# Patient Record
Sex: Female | Born: 1960
Health system: Southern US, Community
[De-identification: ages and names within clinical notes are randomized; demographics above are authoritative.]

## PROBLEM LIST (undated history)

## (undated) DIAGNOSIS — T7840XA Allergy, unspecified, initial encounter: Secondary | ICD-10-CM

## (undated) DIAGNOSIS — B009 Herpesviral infection, unspecified: Secondary | ICD-10-CM

## (undated) DIAGNOSIS — I219 Acute myocardial infarction, unspecified: Secondary | ICD-10-CM

## (undated) DIAGNOSIS — E785 Hyperlipidemia, unspecified: Secondary | ICD-10-CM

## (undated) DIAGNOSIS — B351 Tinea unguium: Secondary | ICD-10-CM

## (undated) DIAGNOSIS — I10 Essential (primary) hypertension: Secondary | ICD-10-CM

## (undated) DIAGNOSIS — I2510 Atherosclerotic heart disease of native coronary artery without angina pectoris: Secondary | ICD-10-CM

## (undated) DIAGNOSIS — J3081 Allergic rhinitis due to animal (cat) (dog) hair and dander: Secondary | ICD-10-CM

## (undated) DIAGNOSIS — K5790 Diverticulosis of intestine, part unspecified, without perforation or abscess without bleeding: Secondary | ICD-10-CM

## (undated) HISTORY — DX: Allergic rhinitis due to animal (cat) (dog) hair and dander: J30.81

## (undated) HISTORY — DX: Allergy, unspecified, initial encounter: T78.40XA

## (undated) HISTORY — DX: Herpesviral infection, unspecified: B00.9

## (undated) HISTORY — DX: Acute myocardial infarction, unspecified: I21.9

## (undated) HISTORY — DX: Diverticulosis of intestine, part unspecified, without perforation or abscess without bleeding: K57.90

## (undated) HISTORY — DX: Tinea unguium: B35.1

---

## 1980-06-12 HISTORY — PX: OTHER SURGICAL HISTORY: SHX169

## 1984-06-12 HISTORY — PX: REDUCTION MAMMAPLASTY: SUR839

## 1997-12-16 ENCOUNTER — Other Ambulatory Visit: Admission: RE | Admit: 1997-12-16 | Discharge: 1997-12-16 | Payer: Self-pay | Admitting: Obstetrics and Gynecology

## 1999-06-17 ENCOUNTER — Other Ambulatory Visit: Admission: RE | Admit: 1999-06-17 | Discharge: 1999-06-17 | Payer: Self-pay | Admitting: Obstetrics and Gynecology

## 2000-05-22 ENCOUNTER — Ambulatory Visit (HOSPITAL_COMMUNITY): Admission: RE | Admit: 2000-05-22 | Discharge: 2000-05-22 | Payer: Self-pay | Admitting: Obstetrics and Gynecology

## 2000-05-22 ENCOUNTER — Encounter: Payer: Self-pay | Admitting: Obstetrics and Gynecology

## 2000-07-04 ENCOUNTER — Other Ambulatory Visit: Admission: RE | Admit: 2000-07-04 | Discharge: 2000-07-04 | Payer: Self-pay | Admitting: Obstetrics and Gynecology

## 2001-07-15 ENCOUNTER — Encounter: Payer: Self-pay | Admitting: Obstetrics and Gynecology

## 2001-07-15 ENCOUNTER — Ambulatory Visit (HOSPITAL_COMMUNITY): Admission: RE | Admit: 2001-07-15 | Discharge: 2001-07-15 | Payer: Self-pay | Admitting: Obstetrics and Gynecology

## 2001-07-23 ENCOUNTER — Other Ambulatory Visit: Admission: RE | Admit: 2001-07-23 | Discharge: 2001-07-23 | Payer: Self-pay | Admitting: Obstetrics and Gynecology

## 2002-07-16 ENCOUNTER — Ambulatory Visit (HOSPITAL_COMMUNITY): Admission: RE | Admit: 2002-07-16 | Discharge: 2002-07-16 | Payer: Self-pay | Admitting: Obstetrics and Gynecology

## 2002-07-16 ENCOUNTER — Encounter: Payer: Self-pay | Admitting: Obstetrics and Gynecology

## 2002-07-31 ENCOUNTER — Other Ambulatory Visit: Admission: RE | Admit: 2002-07-31 | Discharge: 2002-07-31 | Payer: Self-pay | Admitting: Obstetrics and Gynecology

## 2003-07-24 ENCOUNTER — Ambulatory Visit (HOSPITAL_COMMUNITY): Admission: RE | Admit: 2003-07-24 | Discharge: 2003-07-24 | Payer: Self-pay | Admitting: Obstetrics and Gynecology

## 2003-08-05 ENCOUNTER — Other Ambulatory Visit: Admission: RE | Admit: 2003-08-05 | Discharge: 2003-08-05 | Payer: Self-pay | Admitting: Obstetrics and Gynecology

## 2004-01-06 ENCOUNTER — Other Ambulatory Visit: Admission: RE | Admit: 2004-01-06 | Discharge: 2004-01-06 | Payer: Self-pay | Admitting: Obstetrics and Gynecology

## 2004-07-21 ENCOUNTER — Ambulatory Visit (HOSPITAL_COMMUNITY): Admission: RE | Admit: 2004-07-21 | Discharge: 2004-07-21 | Payer: Self-pay | Admitting: Obstetrics and Gynecology

## 2004-08-24 ENCOUNTER — Other Ambulatory Visit: Admission: RE | Admit: 2004-08-24 | Discharge: 2004-08-24 | Payer: Self-pay | Admitting: Obstetrics and Gynecology

## 2005-08-02 ENCOUNTER — Ambulatory Visit (HOSPITAL_COMMUNITY): Admission: RE | Admit: 2005-08-02 | Discharge: 2005-08-02 | Payer: Self-pay | Admitting: Obstetrics and Gynecology

## 2005-08-22 ENCOUNTER — Other Ambulatory Visit: Admission: RE | Admit: 2005-08-22 | Discharge: 2005-08-22 | Payer: Self-pay | Admitting: Obstetrics & Gynecology

## 2006-08-21 ENCOUNTER — Ambulatory Visit (HOSPITAL_COMMUNITY): Admission: RE | Admit: 2006-08-21 | Discharge: 2006-08-21 | Payer: Self-pay | Admitting: Obstetrics and Gynecology

## 2006-09-28 ENCOUNTER — Other Ambulatory Visit: Admission: RE | Admit: 2006-09-28 | Discharge: 2006-09-28 | Payer: Self-pay | Admitting: Obstetrics and Gynecology

## 2007-08-22 ENCOUNTER — Ambulatory Visit (HOSPITAL_COMMUNITY): Admission: RE | Admit: 2007-08-22 | Discharge: 2007-08-22 | Payer: Self-pay | Admitting: Obstetrics and Gynecology

## 2007-10-04 ENCOUNTER — Other Ambulatory Visit: Admission: RE | Admit: 2007-10-04 | Discharge: 2007-10-04 | Payer: Self-pay | Admitting: Obstetrics and Gynecology

## 2008-09-08 ENCOUNTER — Ambulatory Visit (HOSPITAL_COMMUNITY): Admission: RE | Admit: 2008-09-08 | Discharge: 2008-09-08 | Payer: Self-pay | Admitting: Obstetrics and Gynecology

## 2008-10-12 ENCOUNTER — Other Ambulatory Visit: Admission: RE | Admit: 2008-10-12 | Discharge: 2008-10-12 | Payer: Self-pay | Admitting: Obstetrics and Gynecology

## 2009-09-15 ENCOUNTER — Ambulatory Visit (HOSPITAL_COMMUNITY): Admission: RE | Admit: 2009-09-15 | Discharge: 2009-09-15 | Payer: Self-pay | Admitting: Obstetrics and Gynecology

## 2009-10-10 HISTORY — PX: ENDOMETRIAL BIOPSY: SHX622

## 2010-09-09 ENCOUNTER — Other Ambulatory Visit: Payer: Self-pay | Admitting: Obstetrics and Gynecology

## 2010-09-09 DIAGNOSIS — Z1231 Encounter for screening mammogram for malignant neoplasm of breast: Secondary | ICD-10-CM

## 2010-09-22 ENCOUNTER — Ambulatory Visit (HOSPITAL_COMMUNITY): Payer: BC Managed Care – PPO

## 2010-09-29 ENCOUNTER — Ambulatory Visit (HOSPITAL_COMMUNITY)
Admission: RE | Admit: 2010-09-29 | Discharge: 2010-09-29 | Disposition: A | Discharge status: Home / Self Care | Payer: BC Managed Care – PPO | Source: Ambulatory Visit | Attending: Obstetrics and Gynecology | Admitting: Obstetrics and Gynecology

## 2010-09-29 DIAGNOSIS — Z1231 Encounter for screening mammogram for malignant neoplasm of breast: Secondary | ICD-10-CM | POA: Insufficient documentation

## 2011-04-13 DIAGNOSIS — K5790 Diverticulosis of intestine, part unspecified, without perforation or abscess without bleeding: Secondary | ICD-10-CM

## 2011-04-13 HISTORY — DX: Diverticulosis of intestine, part unspecified, without perforation or abscess without bleeding: K57.90

## 2011-04-28 ENCOUNTER — Ambulatory Visit (AMBULATORY_SURGERY_CENTER): Payer: BC Managed Care – PPO | Admitting: *Deleted

## 2011-04-28 VITALS — Ht 63.0 in | Wt 147.1 lb

## 2011-04-28 DIAGNOSIS — Z1211 Encounter for screening for malignant neoplasm of colon: Secondary | ICD-10-CM

## 2011-04-28 MED ORDER — PEG-KCL-NACL-NASULF-NA ASC-C 100 G PO SOLR: ORAL | Status: DC

## 2011-05-12 ENCOUNTER — Ambulatory Visit (AMBULATORY_SURGERY_CENTER): Payer: BC Managed Care – PPO | Admitting: Internal Medicine

## 2011-05-12 ENCOUNTER — Encounter: Payer: Self-pay | Admitting: Internal Medicine

## 2011-05-12 VITALS — BP 133/89 | HR 68 | Temp 98.0°F | Resp 18 | Ht 63.0 in | Wt 147.0 lb

## 2011-05-12 DIAGNOSIS — Z1211 Encounter for screening for malignant neoplasm of colon: Secondary | ICD-10-CM

## 2011-05-12 MED ORDER — SODIUM CHLORIDE 0.9 % IV SOLN: 500.0000 mL | INTRAVENOUS | Status: DC

## 2011-05-12 NOTE — Progress Notes (Signed)
Patient did not experience any of the following events: a burn prior to discharge; a fall within the facility; wrong site/side/patient/procedure/implant event; or a hospital transfer or hospital admission upon discharge from the facility. (G8907) Patient did not have preoperative order for IV antibiotic SSI prophylaxis. (G8918)  

## 2011-05-12 NOTE — Patient Instructions (Signed)
Discharge instructions given with verbal understanding. Handouts on diverticulosis and a high fiber diet given. Resume previous medications. 

## 2011-05-12 NOTE — Progress Notes (Signed)
Pt tolerated the colonoscopy very well. maw 

## 2011-05-15 ENCOUNTER — Telehealth: Payer: Self-pay

## 2011-05-15 NOTE — Telephone Encounter (Signed)
Left message

## 2011-06-13 HISTORY — PX: COLONOSCOPY: SHX174

## 2011-08-22 ENCOUNTER — Other Ambulatory Visit: Payer: Self-pay | Admitting: Obstetrics and Gynecology

## 2011-08-22 DIAGNOSIS — Z1231 Encounter for screening mammogram for malignant neoplasm of breast: Secondary | ICD-10-CM

## 2011-10-03 ENCOUNTER — Ambulatory Visit (HOSPITAL_COMMUNITY)
Admission: RE | Admit: 2011-10-03 | Discharge: 2011-10-03 | Disposition: A | Discharge status: Home / Self Care | Payer: BC Managed Care – PPO | Source: Ambulatory Visit | Attending: Obstetrics and Gynecology | Admitting: Obstetrics and Gynecology

## 2011-10-03 DIAGNOSIS — Z1231 Encounter for screening mammogram for malignant neoplasm of breast: Secondary | ICD-10-CM | POA: Insufficient documentation

## 2012-11-12 ENCOUNTER — Other Ambulatory Visit: Payer: Self-pay | Admitting: Obstetrics and Gynecology

## 2012-11-12 DIAGNOSIS — Z1231 Encounter for screening mammogram for malignant neoplasm of breast: Secondary | ICD-10-CM

## 2012-11-14 ENCOUNTER — Ambulatory Visit (HOSPITAL_COMMUNITY)
Admission: RE | Admit: 2012-11-14 | Discharge: 2012-11-14 | Disposition: A | Discharge status: Home / Self Care | Payer: BC Managed Care – PPO | Source: Ambulatory Visit | Attending: Obstetrics and Gynecology | Admitting: Obstetrics and Gynecology

## 2012-11-14 DIAGNOSIS — Z1231 Encounter for screening mammogram for malignant neoplasm of breast: Secondary | ICD-10-CM | POA: Insufficient documentation

## 2013-01-15 ENCOUNTER — Ambulatory Visit: Payer: Self-pay | Admitting: Obstetrics and Gynecology

## 2013-01-16 ENCOUNTER — Encounter: Payer: Self-pay | Admitting: Obstetrics and Gynecology

## 2013-01-17 ENCOUNTER — Ambulatory Visit (INDEPENDENT_AMBULATORY_CARE_PROVIDER_SITE_OTHER): Payer: BC Managed Care – PPO | Admitting: Obstetrics and Gynecology

## 2013-01-17 ENCOUNTER — Encounter: Payer: Self-pay | Admitting: Obstetrics and Gynecology

## 2013-01-17 VITALS — BP 110/74 | HR 76 | Resp 18 | Ht 62.5 in | Wt 155.0 lb

## 2013-01-17 DIAGNOSIS — Z01419 Encounter for gynecological examination (general) (routine) without abnormal findings: Secondary | ICD-10-CM

## 2013-01-17 DIAGNOSIS — Z0000 Encounter for general adult medical examination without abnormal findings: Secondary | ICD-10-CM

## 2013-01-17 LAB — POCT URINALYSIS DIPSTICK
Blood, UA: NEGATIVE
Glucose, UA: NEGATIVE
Nitrite, UA: NEGATIVE
Protein, UA: NEGATIVE
pH, UA: 5

## 2013-01-17 LAB — HEMOGLOBIN, FINGERSTICK: Hemoglobin, fingerstick: 13.8 g/dL (ref 12.0–16.0)

## 2013-01-17 NOTE — Progress Notes (Signed)
52 y.o.   Married /Separated  Caucasian   female   G2P0020   here for annual exam.    Patient's last menstrual period was 06/12/2009.          Sexually active: yes  The current method of family planning is post menopausal status.    Exercising: walking, cycling 2-4 days a week Last mammogram:  10/2012 normal Last pap smear:12/20/09 neg History of abnormal pap: no Smoking: no Alcohol: 4-5 drinks a week (beer or wine) Last colonoscopy:05/12/11 diverticulosis Last Bone Density:  never Last tetanus shot:less than 10 years Last cholesterol check: 12/27/11 normal  Hgb:  13.8              Urine: neg   Family History  Problem Relation Age of Onset  . Colon cancer Neg Hx   . Esophageal cancer Neg Hx   . Stomach cancer Neg Hx   . Hypertension Father   . Heart disease Father   . Diabetes Father     There are no active problems to display for this patient.   Past Medical History  Diagnosis Date  . Cat allergies   . Diverticulosis 04/2011    Past Surgical History  Procedure Laterality Date  . Bilateral breast reduction  1982    Allergies: Review of patient's allergies indicates no known allergies.  Current Outpatient Prescriptions  Medication Sig Dispense Refill  . B Complex Vitamins (VITAMIN B COMPLEX) CAPS Take by mouth daily.        . Calcium Carbonate-Vitamin D (CALCIUM + D PO) Take 500 mg by mouth daily.       . Cetirizine HCl (ZYRTEC PO) Take by mouth daily.      . fish oil-omega-3 fatty acids 1000 MG capsule Take 2 g by mouth daily.      . Multiple Vitamin (MULTIVITAMIN) tablet Take 1 tablet by mouth daily.        . vitamin C (ASCORBIC ACID) 500 MG tablet Take 500 mg by mouth daily.         No current facility-administered medications for this visit.    ROS: Pertinent items are noted in HPI.  Social Hx: separated, no children, works as a Proofreader    Exam:    BP 110/74  Pulse 76  Resp 18  Ht 5' 2.5" (1.588 m)  Wt 155 lb (70.308 kg)  BMI 27.88 kg/m2   LMP 06/12/2009 Ht stable, wt up 5 pounds from last year  Wt Readings from Last 3 Encounters:  01/17/13 155 lb (70.308 kg)  05/12/11 147 lb (66.679 kg)  04/28/11 147 lb 1.6 oz (66.724 kg)     Ht Readings from Last 3 Encounters:  01/17/13 5' 2.5" (1.588 m)  05/12/11 5\' 3"  (1.6 m)  04/28/11 5\' 3"  (1.6 m)    General appearance: alert, cooperative and appears stated age Head: Normocephalic, without obvious abnormality, atraumatic Neck: no adenopathy, supple, symmetrical, trachea midline and thyroid not enlarged, symmetric, no tenderness/mass/nodules Lungs: clear to auscultation bilaterally Breasts: Inspection negative, No nipple retraction or dimpling, No nipple discharge or bleeding, No axillary or supraclavicular adenopathy, Normal to palpation without dominant masses Heart: regular rate and rhythm Abdomen: soft, non-tender; bowel sounds normal; no masses,  no organomegaly Extremities: extremities normal, atraumatic, no cyanosis or edema Skin: Skin color, texture, turgor normal. No rashes or lesions Lymph nodes: Cervical, supraclavicular, and axillary nodes normal. No abnormal inguinal nodes palpated Neurologic: Grossly normal   Pelvic: External genitalia:  no lesions  Urethra:  normal appearing urethra with no masses, tenderness or lesions              Bartholins and Skenes: normal                 Vagina: normal appearing vagina with normal color and discharge, no lesions              Cervix: normal appearance              Pap taken: yes        Bimanual Exam:  Uterus:  uterus is normal size, shape, consistency and nontender                                      Adnexa: normal adnexa in size, nontender and no masses                                      Rectovaginal: Confirms                                      Anus:  normal sphincter tone, no lesions  A: normal menopausal exam, no HRT     P:     mammogram pap smear counseled on breast self exam, mammography  screening, adequate intake of calcium and vitamin D, diet and exercise return annually or prn     An After Visit Summary was printed and given to the patient.

## 2013-01-17 NOTE — Patient Instructions (Signed)

## 2013-01-22 LAB — IPS PAP TEST WITH HPV

## 2013-11-14 ENCOUNTER — Other Ambulatory Visit: Payer: Self-pay | Admitting: Obstetrics and Gynecology

## 2013-11-14 DIAGNOSIS — Z1231 Encounter for screening mammogram for malignant neoplasm of breast: Secondary | ICD-10-CM

## 2013-11-25 ENCOUNTER — Ambulatory Visit (HOSPITAL_COMMUNITY)
Admission: RE | Admit: 2013-11-25 | Discharge: 2013-11-25 | Disposition: A | Discharge status: Home / Self Care | Payer: BC Managed Care – PPO | Source: Ambulatory Visit | Attending: Obstetrics and Gynecology | Admitting: Obstetrics and Gynecology

## 2013-11-25 DIAGNOSIS — Z1231 Encounter for screening mammogram for malignant neoplasm of breast: Secondary | ICD-10-CM

## 2014-01-23 ENCOUNTER — Ambulatory Visit: Payer: BC Managed Care – PPO | Admitting: Obstetrics and Gynecology

## 2014-02-09 ENCOUNTER — Encounter: Payer: Self-pay | Admitting: Obstetrics and Gynecology

## 2014-02-09 ENCOUNTER — Ambulatory Visit (INDEPENDENT_AMBULATORY_CARE_PROVIDER_SITE_OTHER): Payer: BC Managed Care – PPO | Admitting: Obstetrics and Gynecology

## 2014-02-09 VITALS — BP 118/78 | HR 60 | Resp 16 | Ht 63.0 in | Wt 163.2 lb

## 2014-02-09 DIAGNOSIS — Z23 Encounter for immunization: Secondary | ICD-10-CM

## 2014-02-09 DIAGNOSIS — Z01419 Encounter for gynecological examination (general) (routine) without abnormal findings: Secondary | ICD-10-CM

## 2014-02-09 DIAGNOSIS — Z0000 Encounter for general adult medical examination without abnormal findings: Secondary | ICD-10-CM

## 2014-02-09 LAB — POCT URINALYSIS DIPSTICK
Bilirubin, UA: NEGATIVE
Blood, UA: NEGATIVE
GLUCOSE UA: NEGATIVE
Ketones, UA: NEGATIVE
LEUKOCYTES UA: NEGATIVE
NITRITE UA: NEGATIVE
PROTEIN UA: NEGATIVE
Urobilinogen, UA: NEGATIVE
pH, UA: 5

## 2014-02-09 NOTE — Patient Instructions (Signed)

## 2014-02-09 NOTE — Progress Notes (Signed)
Patient ID: Leslie Roberts, female   DOB: Nov 29, 1960, 53 y.o.   MRN: [id number] GYNECOLOGY VISIT  PCP:   None  Referring provider:   HPI: 53 y.o.   Married  Caucasian  female   G2P0020 with Patient's last menstrual period was 06/12/2009.   here for  AEX.  Having external itching for the last year.  Feels dry.   Labs at work - cholesterol was under 200 and glucose was normal.  Hgb:    Through Work Urine:  Neg  GYNECOLOGIC HISTORY: Patient's last menstrual period was 06/12/2009. Sexually active:  yes Partner preference: female Contraception:  postmenopausal Menopausal hormone therapy: no DES exposure:   no Blood transfusions: no   Sexually transmitted diseases:  no GYN procedures and prior surgeries: Breast reduction, endometrial biopsy-benign.  Last mammogram:  11-25-13 wnl:[facility]              Last pap and high risk HPV testing:   01-17-13 wnl:neg HR HPV History of abnormal pap smear:  no   OB History   Grav Para Term Preterm Abortions TAB SAB Ect Mult Living   2 0   2 2    0       LIFESTYLE: Exercise:   Sometimes: walking/body pump and elliptical         OTHER HEALTH MAINTENANCE: Tetanus/TDap:   Unsure,  2004 HPV:                   n/a Influenza:            03/2013   Bone density:     never Colonoscopy:     05-12-11 diverticulosis.  Next colonoscopy due 04/2021.  Cholesterol check:   Normal through work screening   Family History  Problem Relation Age of Onset  . Colon cancer Neg Hx   . Esophageal cancer Neg Hx   . Stomach cancer Neg Hx   . Hypertension Father   . Heart disease Father   . Diabetes Father     There are no active problems to display for this patient.  Past Medical History  Diagnosis Date  . Cat allergies   . Diverticulosis 04/2011    Past Surgical History  Procedure Laterality Date  . Bilateral breast reduction  1982  . Endometrial biopsy  10/2009    benign    ALLERGIES: Review of patient's allergies indicates no known  allergies.  Current Outpatient Prescriptions  Medication Sig Dispense Refill  . B Complex Vitamins (VITAMIN B COMPLEX) CAPS Take by mouth daily.        . Calcium Carbonate-Vitamin D (CALCIUM + D PO) Take 500 mg by mouth daily.       . Cetirizine HCl (ZYRTEC PO) Take by mouth daily.      . fish oil-omega-3 fatty acids 1000 MG capsule Take 2 g by mouth daily.      . Multiple Vitamin (MULTIVITAMIN) tablet Take 1 tablet by mouth daily.        . vitamin C (ASCORBIC ACID) 500 MG tablet Take 500 mg by mouth daily.         No current facility-administered medications for this visit.     ROS:  Pertinent items are noted in HPI.  History   Social History  . Marital Status: Married    Spouse Name: N/A    Number of Children: N/A  . Years of Education: N/A   Occupational History  . Not on file.   Social History Main  Topics  . Smoking status: Never Smoker   . Smokeless tobacco: Never Used  . Alcohol Use: 2.4 - 3.6 oz/week    4-6 Glasses of wine per week     Comment: 4-6 drinks a week (wine or beer)  . Drug Use: No  . Sexual Activity: Yes    Partners: Male    Birth Control/ Protection: Post-menopausal   Other Topics Concern  . Not on file   Social History Narrative  . No narrative on file    PHYSICAL EXAMINATION:    BP 118/78  Pulse 60  Resp 16  Ht  (1.6 m)  Wt 163 lb 3.2 oz (74.027 kg)  BMI 28.92 kg/m2  LMP 06/12/2009   Wt Readings from Last 3 Encounters:  02/09/14 163 lb 3.2 oz (74.027 kg)  01/17/13 155 lb (70.308 kg)  05/12/11 147 lb (66.679 kg)     Ht Readings from Last 3 Encounters:  02/09/14  (1.6 m)  01/17/13 5' 2.5" (1.588 m)  05/12/11  (1.6 m)    General appearance: alert, cooperative and appears stated age Head: Normocephalic, without obvious abnormality, atraumatic Neck: no adenopathy, supple, symmetrical, trachea midline and thyroid not enlarged, symmetric, no tenderness/mass/nodules Lungs: clear to auscultation bilaterally Breasts:  Inspection negative, No nipple retraction or dimpling, No nipple discharge or bleeding, No axillary or supraclavicular adenopathy, Normal to palpation without dominant masses Heart: regular rate and rhythm Abdomen: soft, non-tender; no masses,  no organomegaly Extremities: extremities normal, atraumatic, no cyanosis or edema Skin: Skin color, texture, turgor normal. No rashes or lesions Lymph nodes: Cervical, supraclavicular, and axillary nodes normal. No abnormal inguinal nodes palpated Neurologic: Grossly normal  Pelvic: External genitalia:  no lesions              Urethra:  normal appearing urethra with no masses, tenderness or lesions              Bartholins and Skenes: normal                 Vagina: normal appearing vagina with erythema to mucosa and orangish discharge, no lesions              Cervix: normal appearance              Pap and high risk HPV testing done: No.        Bimanual Exam:  Uterus:  uterus is normal size, shape, consistency and nontender                                      Adnexa: normal adnexa in size, nontender and no masses                                      Rectovaginal:  Yes.                                        Confirms above.                                      Anus:  normal sphincter tone, no lesions  ASSESSMENT  Normal gynecologic exam. Vaginal atrophy.  PLAN  Mammogram recommended yearly starting at age 49. Pap smear and high risk HPV testing as above. Counseled on self breast exam, Calcium and vitamin D intake, exercise. See lab orders: No. Patient will try Vitamin E vaginal suppositories and cooking oils.  Will not do vaginal estrogen at this time, but I  have discussed risks of vagina estrogen therapy - DVT, PE, MI, stroke, breast cancer, in case patient calls back to request a prescription.  TDap. Return annually or prn   An After Visit Summary was printed and given to the patient.

## 2014-04-13 ENCOUNTER — Encounter: Payer: Self-pay | Admitting: Obstetrics and Gynecology

## 2015-04-16 ENCOUNTER — Encounter: Payer: Self-pay | Admitting: Obstetrics and Gynecology

## 2015-04-16 ENCOUNTER — Ambulatory Visit (INDEPENDENT_AMBULATORY_CARE_PROVIDER_SITE_OTHER): Payer: 59 | Admitting: Obstetrics and Gynecology

## 2015-04-16 VITALS — BP 108/74 | HR 80 | Resp 18 | Ht 62.5 in | Wt 170.6 lb

## 2015-04-16 DIAGNOSIS — Z0000 Encounter for general adult medical examination without abnormal findings: Secondary | ICD-10-CM | POA: Diagnosis not present

## 2015-04-16 DIAGNOSIS — Z01419 Encounter for gynecological examination (general) (routine) without abnormal findings: Secondary | ICD-10-CM

## 2015-04-16 DIAGNOSIS — N952 Postmenopausal atrophic vaginitis: Secondary | ICD-10-CM

## 2015-04-16 LAB — CBC
HEMATOCRIT: 40.2 % (ref 36.0–46.0)
HEMOGLOBIN: 13.6 g/dL (ref 12.0–15.0)
MCH: 31.1 pg (ref 26.0–34.0)
MCHC: 33.8 g/dL (ref 30.0–36.0)
MCV: 91.8 fL (ref 78.0–100.0)
MPV: 11.3 fL (ref 8.6–12.4)
Platelets: 293 10*3/uL (ref 150–400)
RBC: 4.38 MIL/uL (ref 3.87–5.11)
RDW: 13 % (ref 11.5–15.5)
WBC: 5.7 10*3/uL (ref 4.0–10.5)

## 2015-04-16 LAB — COMPREHENSIVE METABOLIC PANEL
ALBUMIN: 4 g/dL (ref 3.6–5.1)
ALK PHOS: 90 U/L (ref 33–130)
ALT: 16 U/L (ref 6–29)
AST: 17 U/L (ref 10–35)
BILIRUBIN TOTAL: 0.3 mg/dL (ref 0.2–1.2)
BUN: 14 mg/dL (ref 7–25)
CO2: 26 mmol/L (ref 20–31)
CREATININE: 0.94 mg/dL (ref 0.50–1.05)
Calcium: 9.1 mg/dL (ref 8.6–10.4)
Chloride: 103 mmol/L (ref 98–110)
Glucose, Bld: 81 mg/dL (ref 65–99)
Potassium: 4.5 mmol/L (ref 3.5–5.3)
SODIUM: 141 mmol/L (ref 135–146)
TOTAL PROTEIN: 6.9 g/dL (ref 6.1–8.1)

## 2015-04-16 LAB — POCT URINALYSIS DIPSTICK
Bilirubin, UA: NEGATIVE
Blood, UA: NEGATIVE
Glucose, UA: NEGATIVE
KETONES UA: NEGATIVE
LEUKOCYTES UA: NEGATIVE
Nitrite, UA: NEGATIVE
PH UA: 5
PROTEIN UA: NEGATIVE
Urobilinogen, UA: NEGATIVE

## 2015-04-16 LAB — HEMOGLOBIN, FINGERSTICK: HEMOGLOBIN, FINGERSTICK: 13.2 g/dL (ref 12.0–16.0)

## 2015-04-16 LAB — LIPID PANEL
Cholesterol: 195 mg/dL (ref 125–200)
HDL: 50 mg/dL (ref >=46)
LDL CALC: 102 mg/dL (ref <130)
TRIGLYCERIDES: 216 mg/dL — AB (ref <150)
Total CHOL/HDL Ratio: 3.9 Ratio (ref <=5.0)
VLDL: 43 mg/dL — ABNORMAL HIGH (ref <30)

## 2015-04-16 LAB — TSH: TSH: 3.549 u[IU]/mL (ref 0.350–4.500)

## 2015-04-16 NOTE — Progress Notes (Signed)
Patient ID: Leslie Roberts, female   DOB: 1960-07-24, 54 y.o.   MRN: [id number] 54 y.o. G24P0020 Married Caucasian female here for annual exam.    Gaining weight.   Not exercising.   Intercourse is uncomfortable.   Mother passed age 55 yo in last year from metastatic cancer of unknown origin.   PCP:  Deboraha Sprang @ [facility]  Patient's last menstrual period was 06/12/2009.          Sexually active: Yes.   female The current method of family planning is post menopausal status.    Exercising: No."hoping to get back soon in a routine" Smoker:  no  Health Maintenance: Pap:  01-17-13 Neg:Neg HR HPV History of abnormal Pap:  no MMG:  11-26-13 Density Cat.A/Neg/BiRads1:[facility]. Colonoscopy:  05-12-11 diverticulosis/nl with Dr. Erick Blinks.  Next 04/2021. BMD:   n/a  Result  n/a TDaP:  02-09-14 Screening Labs:  Hb today: 13.2, Urine today: Neg   reports that she has never smoked. She has never used smokeless tobacco. She reports that she drinks about 2.4 - 3.6 oz of alcohol per week. She reports that she does not use illicit drugs.  Past Medical History  Diagnosis Date  . Cat allergies   . Diverticulosis 04/2011  . Toenail fungus     Past Surgical History  Procedure Laterality Date  . Bilateral breast reduction  1982  . Endometrial biopsy  10/2009    benign    Current Outpatient Prescriptions  Medication Sig Dispense Refill  . B Complex Vitamins (VITAMIN B COMPLEX) CAPS Take by mouth daily.      . Calcium Carbonate-Vitamin D (CALCIUM + D PO) Take 500 mg by mouth daily.     . Cetirizine HCl (ZYRTEC PO) Take by mouth daily.    . fish oil-omega-3 fatty acids 1000 MG capsule Take 2 g by mouth daily.    . Multiple Vitamin (MULTIVITAMIN) tablet Take 1 tablet by mouth daily.      . vitamin C (ASCORBIC ACID) 500 MG tablet Take 500 mg by mouth daily.       No current facility-administered medications for this visit.    Family History  Problem Relation Age of Onset  . Colon cancer  Neg Hx 55    Dec cancer unknown origin  . Esophageal cancer Neg Hx   . Stomach cancer Neg Hx   . Hypertension Father   . Heart disease Father   . Diabetes Father   . Cancer Mother   . Thyroid disease Mother     hyperthyroid  . Thyroid disease Sister     hypothyroid    ROS:  Pertinent items are noted in HPI.  Otherwise, a comprehensive ROS was negative.  Exam:   BP 108/74 mmHg  Pulse 80  Resp 18  Ht 5' 2.5" (1.588 m)  Wt 170 lb 9.6 oz (77.384 kg)  BMI 30.69 kg/m2  LMP 06/12/2009    General appearance: alert, cooperative and appears stated age Head: Normocephalic, without obvious abnormality, atraumatic Neck: no adenopathy, supple, symmetrical, trachea midline and thyroid normal to inspection and palpation Lungs: clear to auscultation bilaterally Breasts: normal appearance, no masses or tenderness, Inspection negative, No nipple retraction or dimpling, No nipple discharge or bleeding, No axillary or supraclavicular adenopathy Heart: regular rate and rhythm Abdomen: soft, non-tender; bowel sounds normal; no masses,  no organomegaly Extremities: extremities normal, atraumatic, no cyanosis or edema Skin: Skin color, texture, turgor normal. No rashes or lesions Lymph nodes: Cervical, supraclavicular, and axillary nodes  normal. No abnormal inguinal nodes palpated Neurologic: Grossly normal  Pelvic: External genitalia:  no lesions              Urethra:  normal appearing urethra with no masses, tenderness or lesions              Bartholins and Skenes: normal                 Vagina: generalized erythema and orange discharge.              Cervix: no lesions              Pap taken: No. Bimanual Exam:  Uterus:  normal size, contour, position, consistency, mobility, non-tender              Adnexa: normal adnexa and no mass, fullness, tenderness              Rectovaginal: Yes.  .  Confirms.              Anus:  normal sphincter tone, no lesions  Chaperone was present for  exam.  Assessment:   Well woman visit with normal exam. Vaginal atrophy.  Weight gain.   Plan: Yearly mammogram recommended after age 78.  Patient will schedule at [facility].  Recommended self breast exam.  Pap and HR HPV as above. Discussed Calcium, Vitamin D, regular exercise program including cardiovascular and weight bearing exercise. Discussed diet and exercise for weight loss.  Labs performed.  Yes.  .   See orders.  Routine labs. Refills given on medications.  No..    I discussed local vaginal estrogen.  Patient will consider Vagifem. I discussed benefits and risks of DVT, PE, MI, stroke, and breast cancer.   She will have her mammogram first and if normal, she may have Rx for Vagifem.  She will let me know as I have not prescribed this for her today. Follow up annually and prn.     After visit summary provided.

## 2015-04-16 NOTE — Patient Instructions (Addendum)
EXERCISE AND DIET:  We recommended that you start or continue a regular exercise program for good health. Regular exercise means any activity that makes your heart beat faster and makes you sweat.  We recommend exercising at least 30 minutes per day at least 3 days a week, preferably 4 or 5.  We also recommend a diet low in fat and sugar.  Inactivity, poor dietary choices and obesity can cause diabetes, heart attack, stroke, and kidney damage, among others.    ALCOHOL AND SMOKING:  Women should limit their alcohol intake to no more than 7 drinks/beers/glasses of wine (combined, not each!) per week. Moderation of alcohol intake to this level decreases your risk of breast cancer and liver damage. And of course, no recreational drugs are part of a healthy lifestyle.  And absolutely no smoking or even second hand smoke. Most people know smoking can cause heart and lung diseases, but did you know it also contributes to weakening of your bones? Aging of your skin?  Yellowing of your teeth and nails?  CALCIUM AND VITAMIN D:  Adequate intake of calcium and Vitamin D are recommended.  The recommendations for exact amounts of these supplements seem to change often, but generally speaking 600 mg of calcium (either carbonate or citrate) and 800 units of Vitamin D per day seems prudent. Certain women may benefit from higher intake of Vitamin D.  If you are among these women, your doctor will have told you during your visit.    PAP SMEARS:  Pap smears, to check for cervical cancer or precancers,  have traditionally been done yearly, although recent scientific advances have shown that most women can have pap smears less often.  However, every woman still should have a physical exam from her gynecologist every year. It will include a breast check, inspection of the vulva and vagina to check for abnormal growths or skin changes, a visual exam of the cervix, and then an exam to evaluate the size and shape of the uterus and  ovaries.  And after 55 years of age, a rectal exam is indicated to check for rectal cancers. We will also provide age appropriate advice regarding health maintenance, like when you should have certain vaccines, screening for sexually transmitted diseases, bone density testing, colonoscopy, mammograms, etc.   MAMMOGRAMS:  All women over 40 years old should have a yearly mammogram. Many facilities now offer a "3D" mammogram, which may cost around $50 extra out of pocket. If possible,  we recommend you accept the option to have the 3D mammogram performed.  It both reduces the number of women who will be called back for extra views which then turn out to be normal, and it is better than the routine mammogram at detecting truly abnormal areas.    COLONOSCOPY:  Colonoscopy to screen for colon cancer is recommended for all women at age 50.  We know, you hate the idea of the prep.  We agree, BUT, having colon cancer and not knowing it is worse!!  Colon cancer so often starts as a polyp that can be seen and removed at colonscopy, which can quite literally save your life!  And if your first colonoscopy is normal and you have no family history of colon cancer, most women don't have to have it again for 10 years.  Once every ten years, you can do something that may end up saving your life, right?  We will be happy to help you get it scheduled when you are ready.    Be sure to check your insurance coverage so you understand how much it will cost.  It may be covered as a preventative service at no cost, but you should check your particular policy.     Estradiol vaginal tablets What is this medicine? ESTRADIOL (es tra DYE ole) vaginal tablet is used to help relieve symptoms of vaginal irritation and dryness that occurs in some women during menopause. This medicine may be used for other purposes; ask your health care provider or pharmacist if you have questions. What should I tell my health care provider before I take this  medicine? They need to know if you have any of these conditions: -abnormal vaginal bleeding -blood vessel disease or blood clots -breast, cervical, endometrial, ovarian, liver, or uterine cancer -dementia -diabetes -gallbladder disease -heart disease or recent heart attack -high blood pressure -high cholesterol -high level of calcium in the blood -hysterectomy -kidney disease -liver disease -migraine headaches -protein C deficiency -protein S deficiency -stroke -systemic lupus erythematosus (SLE) -tobacco smoker -an unusual or allergic reaction to estrogens, other hormones, medicines, foods, dyes, or preservatives -pregnant or trying to get pregnant -breast-feeding How should I use this medicine? This medicine is only for use in the vagina. Do not take by mouth. Wash and dry your hands before and after use. Read package directions carefully. Unwrap the applicator package. Be sure to use a new applicator for each dose. Use at the same time each day. If the tablet has fallen out of the applicator, but is still in the package, carefully place it back into the applicator. If the tablet has fallen out of the package, that applicator should be thrown out and you should use a new applicator containing a new tablet. Lie on your back, part and bend your knees. Gently insert the applicator as far as comfortably possible into the vagina. Then, gently press the plunger until the plunger is fully depressed. This will release the tablet into the vagina. Gently remove the applicator. Throw away the applicator after use. Do not use your medicine more often than directed. Do not stop using except on the advice of your doctor or health care professional. Talk to your pediatrician regarding the use of this medicine in children. This medicine is not approved for use in children. A patient package insert for the product will be given with each prescription and refill. Read this sheet carefully each time. The  sheet may change frequently. Overdosage: If you think you have taken too much of this medicine contact a poison control center or emergency room at once. NOTE: This medicine is only for you. Do not share this medicine with others. What if I miss a dose? If you miss a dose, take it as soon as you can. If it is almost time for your next dose, take only that dose. Do not take double or extra doses. What may interact with this medicine? Do not take this medicine with any of the following medications: -aromatase inhibitors like aminoglutethimide, anastrozole, exemestane, letrozole, testolactone This medicine may also interact with the following medications: -antibiotics used to treat tuberculosis like rifabutin, rifampin and rifapentene -raloxifene or tamoxifen -warfarin This list may not describe all possible interactions. Give your health care provider a list of all the medicines, herbs, non-prescription drugs, or dietary supplements you use. Also tell them if you smoke, drink alcohol, or use illegal drugs. Some items may interact with your medicine. What should I watch for while using this medicine? Visit your health care professional   for regular checks on your progress. You will need a regular breast and pelvic exam. You should also discuss the need for regular mammograms with your health care professional, and follow his or her guidelines. This medicine can make your body retain fluid, making your fingers, hands, or ankles swell. Your blood pressure can go up. Contact your doctor or health care professional if you feel you are retaining fluid. If you have any reason to think you are pregnant; stop taking this medicine at once and contact your doctor or health care professional. Tobacco smoking increases the risk of getting a blood clot or having a stroke, especially if you are more than 55 years old. You are strongly advised not to smoke. If you wear contact lenses and notice visual changes, or if  the lenses begin to feel uncomfortable, consult your eye care specialist. If you are going to have elective surgery, you may need to stop taking this medicine beforehand. Consult your health care professional for advice prior to scheduling the surgery. What side effects may I notice from receiving this medicine? Side effects that you should report to your doctor or health care professional as soon as possible: -allergic reactions like skin rash, itching or hives, swelling of the face, lips, or tongue -breast tissue changes or discharge -changes in vision -chest pain -confusion, trouble speaking or understanding -dark urine -general ill feeling or flu-like symptoms -light-colored stools -nausea, vomiting -pain, swelling, warmth in the leg -right upper belly pain -severe headaches -shortness of breath -sudden numbness or weakness of the face, arm or leg -trouble walking, dizziness, loss of balance or coordination -unusual vaginal bleeding -yellowing of the eyes or skin Side effects that usually do not require medical attention (report to your doctor or health care professional if they continue or are bothersome): -hair loss -increased hunger or thirst -increased urination -symptoms of vaginal infection like itching, irritation or unusual discharge -unusually weak or tired This list may not describe all possible side effects. Call your doctor for medical advice about side effects. You may report side effects to FDA at 1-800-FDA-1088. Where should I keep my medicine? Keep out of the reach of children. Store at room temperature between 15 and 30 degrees C (59 and 86 degrees F). Throw away any unused medicine after the expiration date. NOTE: This sheet is a summary. It may not cover all possible information. If you have questions about this medicine, talk to your doctor, pharmacist, or health care provider.    2016, Elsevier/Gold Standard. (2014-05-13 09:22:51)   

## 2015-04-19 ENCOUNTER — Telehealth: Payer: Self-pay

## 2015-04-19 NOTE — Telephone Encounter (Signed)
Called patient at [phone] to discuss lab results, left message on voicemail to call me back.

## 2015-04-19 NOTE — Telephone Encounter (Signed)
Patient notified of results- see result notes 

## 2015-04-19 NOTE — Telephone Encounter (Signed)
-----   Message from Patton Salles, MD sent at 04/19/2015  7:26 AM EST ----- Please report labs to patient showing elevated triglycerides but favorable cholesterol ratios. I recommend that she increase exercise and reduce saturated fats in her diet.  The thyroid, blood counts, and blood chemistries were normal.

## 2015-05-24 ENCOUNTER — Encounter: Payer: Self-pay | Admitting: Adult Health

## 2015-05-24 ENCOUNTER — Ambulatory Visit (INDEPENDENT_AMBULATORY_CARE_PROVIDER_SITE_OTHER): Payer: 59 | Admitting: Adult Health

## 2015-05-24 VITALS — BP 124/90 | HR 83 | Temp 98.7°F | Ht 62.5 in | Wt 174.6 lb

## 2015-05-24 DIAGNOSIS — B9789 Other viral agents as the cause of diseases classified elsewhere: Secondary | ICD-10-CM

## 2015-05-24 DIAGNOSIS — J029 Acute pharyngitis, unspecified: Secondary | ICD-10-CM | POA: Diagnosis not present

## 2015-05-24 DIAGNOSIS — J028 Acute pharyngitis due to other specified organisms: Principal | ICD-10-CM

## 2015-05-24 LAB — POCT RAPID STREP A (OFFICE): Rapid Strep A Screen: NEGATIVE

## 2015-05-24 MED ORDER — MAGIC MOUTHWASH W/LIDOCAINE: 5.0000 mL | Freq: Three times a day (TID) | ORAL | Status: DC | PRN

## 2015-05-24 NOTE — Progress Notes (Signed)
Subjective:    Patient ID: Leslie Roberts, female    DOB: 02-Jun-1961, 54 y.o.   MRN: [id number]  HPI  54 year old female who presents to the office today for less than 24 hours of sore throat. She was at the pro football game yesterday and during the game her throat started to hurt " I have never had any throat pain like that before. It was like I was swallowing glass." She also endorses a subjective fever, chills and slight cough. When she woke up this morning the pain in her throat was slightly better. She no longer has a cough but she still has trouble swallowing, states " it feels like glass."   Review of Systems  Constitutional: Positive for fever, chills, activity change and fatigue.  HENT: Positive for sore throat and trouble swallowing. Negative for congestion, drooling, ear discharge, ear pain, facial swelling, postnasal drip, rhinorrhea and sinus pressure.   Eyes: Negative.   Respiratory: Negative.   Cardiovascular: Negative.   Neurological: Negative.   All other systems reviewed and are negative.  Past Medical History  Diagnosis Date  . Cat allergies   . Diverticulosis 04/2011  . Toenail fungus     Social History   Social History  . Marital Status: Married    Spouse Name: N/A  . Number of Children: N/A  . Years of Education: N/A   Occupational History  . Not on file.   Social History Main Topics  . Smoking status: Never Smoker   . Smokeless tobacco: Never Used  . Alcohol Use: 2.4 - 3.6 oz/week    4-6 Glasses of wine per week     Comment: 4-6 drinks a week (wine or beer)  . Drug Use: No  . Sexual Activity:    Partners: Male    Birth Control/ Protection: Post-menopausal   Other Topics Concern  . Not on file   Social History Narrative    Past Surgical History  Procedure Laterality Date  . Bilateral breast reduction  1982  . Endometrial biopsy  10/2009    benign    Family History  Problem Relation Age of Onset  . Colon cancer Neg Hx 79    Dec  cancer unknown origin  . Esophageal cancer Neg Hx   . Stomach cancer Neg Hx   . Hypertension Father   . Heart disease Father   . Diabetes Father   . Cancer Mother   . Thyroid disease Mother     hyperthyroid  . Thyroid disease Sister     hypothyroid    No Known Allergies  Current Outpatient Prescriptions on File Prior to Visit  Medication Sig Dispense Refill  . B Complex Vitamins (VITAMIN B COMPLEX) CAPS Take by mouth daily.      . Calcium Carbonate-Vitamin D (CALCIUM + D PO) Take 500 mg by mouth daily.     . Cetirizine HCl (ZYRTEC PO) Take by mouth daily.    . fish oil-omega-3 fatty acids 1000 MG capsule Take 2 g by mouth daily.    . Multiple Vitamin (MULTIVITAMIN) tablet Take 1 tablet by mouth daily.      . vitamin C (ASCORBIC ACID) 500 MG tablet Take 500 mg by mouth daily.       No current facility-administered medications on file prior to visit.    BP 124/90 mmHg  Pulse 83  Temp(Src) 98.7 F (37.1 C) (Oral)  Ht 5' 2.5" (1.588 m)  Wt 174 lb 9.6 oz (79.198 kg)  BMI  31.41 kg/m2  SpO2 96%  LMP 06/12/2009       Objective:   Physical Exam  Constitutional: She is oriented to person, place, and time. She appears well-developed and well-nourished. No distress.  HENT:  Head: Normocephalic and atraumatic.  Right Ear: External ear normal.  Left Ear: External ear normal.  Nose: Nose normal.  Mouth/Throat: Oropharynx is clear and moist. No oropharyngeal exudate.  TM's visualized. No signs of infection No exudate on tonsils, no swelling. Slight redness  Neck: Normal range of motion. Neck supple. No thyromegaly present.  Cardiovascular: Normal rate, regular rhythm, normal heart sounds and intact distal pulses.  Exam reveals no gallop and no friction rub.   No murmur heard. Pulmonary/Chest: Effort normal and breath sounds normal. No respiratory distress. She has no wheezes. She has no rales. She exhibits no tenderness.  Lymphadenopathy:    She has no cervical adenopathy.    Neurological: She is alert and oriented to person, place, and time.  Skin: Skin is warm and dry. No rash noted. She is not diaphoretic. No erythema. No pallor.  Psychiatric: She has a normal mood and affect. Her behavior is normal. Judgment and thought content normal.  Nursing note and vitals reviewed.     Assessment & Plan:  1. Viral sore throat - Strep negative - magic mouthwash w/lidocaine SOLN; Take 5 mLs by mouth 3 (three) times daily as needed for mouth pain.  Dispense: 180 mL; Refill: 0 - warm salt water gargles and honey - Stay hydrated  - Follow up if no improvement in the next 2-3 days.

## 2015-05-24 NOTE — Progress Notes (Signed)
Pre visit review using our clinic review tool, if applicable. No additional management support is needed unless otherwise documented below in the visit note. 

## 2015-05-24 NOTE — Addendum Note (Signed)
Addended by: Azucena Freed on: 05/24/2015 02:41 PM   Modules accepted: Orders

## 2015-05-24 NOTE — Patient Instructions (Addendum)
Strep test came back negative.   Continue with the salt water gargles and honey  Use the magic mouth wash as needed  You can also use Ibuprofen 600mg  every 8 hours as needed.   Follow up if no improvement.     Sore Throat A sore throat is pain, burning, irritation, or scratchiness of the throat. There is often pain or tenderness when swallowing or talking. A sore throat may be accompanied by other symptoms, such as coughing, sneezing, fever, and swollen neck glands. A sore throat is often the first sign of another sickness, such as a cold, flu, strep throat, or mononucleosis (commonly known as mono). Most sore throats go away without medical treatment. CAUSES  The most common causes of a sore throat include:  A viral infection, such as a cold, flu, or mono.  A bacterial infection, such as strep throat, tonsillitis, or whooping cough.  Seasonal allergies.  Dryness in the air.  Irritants, such as smoke or pollution.  Gastroesophageal reflux disease (GERD). HOME CARE INSTRUCTIONS   Only take over-the-counter medicines as directed by your caregiver.  Drink enough fluids to keep your urine clear or pale yellow.  Rest as needed.  Try using throat sprays, lozenges, or sucking on hard candy to ease any pain (if older than 4 years or as directed).  Sip warm liquids, such as broth, herbal tea, or warm water with honey to relieve pain temporarily. You may also eat or drink cold or frozen liquids such as frozen ice pops.  Gargle with salt water (mix 1 tsp salt with 8 oz of water).  Do not smoke and avoid secondhand smoke.  Put a cool-mist humidifier in your bedroom at night to moisten the air. You can also turn on a hot shower and sit in the bathroom with the door closed for 5-10 minutes. SEEK IMMEDIATE MEDICAL CARE IF:  You have difficulty breathing.  You are unable to swallow fluids, soft foods, or your saliva.  You have increased swelling in the throat.  Your sore throat  does not get better in 7 days.  You have nausea and vomiting.  You have a fever or persistent symptoms for more than 2-3 days.  You have a fever and your symptoms suddenly get worse. MAKE SURE YOU:   Understand these instructions.  Will watch your condition.  Will get help right away if you are not doing well or get worse.   This information is not intended to replace advice given to you by your health care provider. Make sure you discuss any questions you have with your health care provider.   Document Released: 07/06/2004 Document Revised: 06/19/2014 Document Reviewed: 02/04/2012 Elsevier Interactive Patient Education [employer]

## 2015-07-02 ENCOUNTER — Other Ambulatory Visit: Payer: Self-pay

## 2015-07-02 DIAGNOSIS — Z1231 Encounter for screening mammogram for malignant neoplasm of breast: Secondary | ICD-10-CM

## 2015-07-20 ENCOUNTER — Ambulatory Visit
Admission: RE | Admit: 2015-07-20 | Discharge: 2015-07-20 | Disposition: A | Discharge status: Home / Self Care | Payer: BLUE CROSS/BLUE SHIELD | Source: Ambulatory Visit

## 2015-07-20 DIAGNOSIS — Z1231 Encounter for screening mammogram for malignant neoplasm of breast: Secondary | ICD-10-CM

## 2016-01-31 ENCOUNTER — Encounter (HOSPITAL_COMMUNITY): Payer: Self-pay

## 2016-01-31 ENCOUNTER — Emergency Department (HOSPITAL_COMMUNITY): Payer: BLUE CROSS/BLUE SHIELD

## 2016-01-31 ENCOUNTER — Inpatient Hospital Stay (HOSPITAL_COMMUNITY)
Admission: EM | Admit: 2016-01-31 | Discharge: 2016-02-02 | DRG: 282 | Disposition: A | Discharge status: Home / Self Care | Payer: BLUE CROSS/BLUE SHIELD | Attending: Cardiology | Admitting: Cardiology

## 2016-01-31 DIAGNOSIS — I10 Essential (primary) hypertension: Secondary | ICD-10-CM | POA: Diagnosis present

## 2016-01-31 DIAGNOSIS — R079 Chest pain, unspecified: Secondary | ICD-10-CM | POA: Diagnosis not present

## 2016-01-31 DIAGNOSIS — E785 Hyperlipidemia, unspecified: Secondary | ICD-10-CM | POA: Diagnosis present

## 2016-01-31 DIAGNOSIS — I214 Non-ST elevation (NSTEMI) myocardial infarction: Secondary | ICD-10-CM | POA: Diagnosis present

## 2016-01-31 DIAGNOSIS — Z79899 Other long term (current) drug therapy: Secondary | ICD-10-CM | POA: Diagnosis not present

## 2016-01-31 DIAGNOSIS — I2510 Atherosclerotic heart disease of native coronary artery without angina pectoris: Secondary | ICD-10-CM | POA: Diagnosis not present

## 2016-01-31 DIAGNOSIS — I219 Acute myocardial infarction, unspecified: Secondary | ICD-10-CM

## 2016-01-31 DIAGNOSIS — Z833 Family history of diabetes mellitus: Secondary | ICD-10-CM | POA: Diagnosis not present

## 2016-01-31 DIAGNOSIS — R110 Nausea: Secondary | ICD-10-CM | POA: Diagnosis present

## 2016-01-31 DIAGNOSIS — Z8249 Family history of ischemic heart disease and other diseases of the circulatory system: Secondary | ICD-10-CM

## 2016-01-31 DIAGNOSIS — I2542 Coronary artery dissection: Secondary | ICD-10-CM | POA: Diagnosis not present

## 2016-01-31 HISTORY — DX: Atherosclerotic heart disease of native coronary artery without angina pectoris: I25.10

## 2016-01-31 HISTORY — DX: Acute myocardial infarction, unspecified: I21.9

## 2016-01-31 HISTORY — DX: Essential (primary) hypertension: I10

## 2016-01-31 HISTORY — DX: Hyperlipidemia, unspecified: E78.5

## 2016-01-31 LAB — BASIC METABOLIC PANEL
ANION GAP: 8 (ref 5–15)
BUN: 17 mg/dL (ref 6–20)
CALCIUM: 9.4 mg/dL (ref 8.9–10.3)
CO2: 25 mmol/L (ref 22–32)
Chloride: 105 mmol/L (ref 101–111)
Creatinine, Ser: 0.59 mg/dL (ref 0.44–1.00)
GFR calc Af Amer: >60 mL/min (ref >60)
Glucose, Bld: 93 mg/dL (ref 65–99)
POTASSIUM: 3.5 mmol/L (ref 3.5–5.1)
SODIUM: 138 mmol/L (ref 135–145)

## 2016-01-31 LAB — CBC
HEMATOCRIT: 42 % (ref 36.0–46.0)
HEMOGLOBIN: 14 g/dL (ref 12.0–15.0)
MCH: 31 pg (ref 26.0–34.0)
MCHC: 33.3 g/dL (ref 30.0–36.0)
MCV: 92.9 fL (ref 78.0–100.0)
Platelets: 276 10*3/uL (ref 150–400)
RBC: 4.52 MIL/uL (ref 3.87–5.11)
RDW: 12.6 % (ref 11.5–15.5)
WBC: 8.1 10*3/uL (ref 4.0–10.5)

## 2016-01-31 LAB — PROTIME-INR
INR: 0.95
Prothrombin Time: 12.7 seconds (ref 11.4–15.2)

## 2016-01-31 LAB — TROPONIN I: Troponin I: 0.38 ng/mL (ref <0.03)

## 2016-01-31 LAB — I-STAT TROPONIN, ED: TROPONIN I, POC: 0.23 ng/mL — AB (ref 0.00–0.08)

## 2016-01-31 LAB — APTT: aPTT: 31 seconds (ref 24–36)

## 2016-01-31 MED ORDER — ACETAMINOPHEN 325 MG PO TABS
650.0000 mg | ORAL_TABLET | ORAL | Status: DC | PRN
  Administered 2016-02-01: 650 mg via ORAL
  Filled 2016-01-31 (×2): qty 2

## 2016-01-31 MED ORDER — SODIUM CHLORIDE 0.9 % IV SOLN
250.0000 mL | INTRAVENOUS | Status: DC | PRN
Start: 2016-01-31 — End: 2016-02-01

## 2016-01-31 MED ORDER — ONDANSETRON HCL 4 MG/2ML IJ SOLN
4.0000 mg | Freq: Four times a day (QID) | INTRAMUSCULAR | Status: DC | PRN
  Administered 2016-02-02: 4 mg via INTRAVENOUS
  Filled 2016-01-31: qty 2

## 2016-01-31 MED ORDER — SODIUM CHLORIDE 0.9 % WEIGHT BASED INFUSION
1.0000 mL/kg/h | INTRAVENOUS | Status: DC
  Administered 2016-02-01: 1 mL/kg/h via INTRAVENOUS

## 2016-01-31 MED ORDER — ASPIRIN 81 MG PO CHEW
324.0000 mg | CHEWABLE_TABLET | Freq: Once | ORAL | Status: AC
  Administered 2016-01-31: 324 mg via ORAL
  Filled 2016-01-31: qty 4

## 2016-01-31 MED ORDER — HEPARIN BOLUS VIA INFUSION
4000.0000 [IU] | Freq: Once | INTRAVENOUS | Status: AC
  Administered 2016-01-31: 4000 [IU] via INTRAVENOUS
  Filled 2016-01-31: qty 4000

## 2016-01-31 MED ORDER — SODIUM CHLORIDE 0.9 % WEIGHT BASED INFUSION
3.0000 mL/kg/h | INTRAVENOUS | Status: DC
  Administered 2016-02-01: 3 mL/kg/h via INTRAVENOUS

## 2016-01-31 MED ORDER — METOPROLOL TARTRATE 12.5 MG HALF TABLET
12.5000 mg | ORAL_TABLET | Freq: Two times a day (BID) | ORAL | Status: DC
  Administered 2016-01-31 – 2016-02-02 (×3): 12.5 mg via ORAL
  Filled 2016-01-31 (×3): qty 1

## 2016-01-31 MED ORDER — ATORVASTATIN CALCIUM 80 MG PO TABS
80.0000 mg | ORAL_TABLET | Freq: Every day | ORAL | Status: DC
Start: 2016-01-31 — End: 2016-02-01
  Administered 2016-01-31: 80 mg via ORAL
  Filled 2016-01-31: qty 1

## 2016-01-31 MED ORDER — ASPIRIN 81 MG PO CHEW
81.0000 mg | CHEWABLE_TABLET | ORAL | Status: AC
  Administered 2016-02-01: 81 mg via ORAL
  Filled 2016-01-31: qty 1

## 2016-01-31 MED ORDER — SODIUM CHLORIDE 0.9% FLUSH: 3.0000 mL | INTRAVENOUS | Status: DC | PRN

## 2016-01-31 MED ORDER — SODIUM CHLORIDE 0.9% FLUSH
3.0000 mL | Freq: Two times a day (BID) | INTRAVENOUS | Status: DC
  Administered 2016-01-31: 3 mL via INTRAVENOUS

## 2016-01-31 MED ORDER — HEPARIN (PORCINE) IN NACL 100-0.45 UNIT/ML-% IJ SOLN
800.0000 [IU]/h | INTRAMUSCULAR | Status: DC
  Administered 2016-01-31: 800 [IU]/h via INTRAVENOUS
  Filled 2016-01-31: qty 250

## 2016-01-31 MED ORDER — NITROGLYCERIN 0.4 MG SL SUBL: 0.4000 mg | SUBLINGUAL_TABLET | SUBLINGUAL | Status: DC | PRN

## 2016-01-31 MED ORDER — NITROGLYCERIN 0.4 MG SL SUBL
0.4000 mg | SUBLINGUAL_TABLET | SUBLINGUAL | Status: DC | PRN
  Administered 2016-01-31: 0.4 mg via SUBLINGUAL
  Filled 2016-01-31: qty 1

## 2016-01-31 MED ORDER — ASPIRIN EC 81 MG PO TBEC
81.0000 mg | DELAYED_RELEASE_TABLET | Freq: Every day | ORAL | Status: DC
  Administered 2016-02-02: 81 mg via ORAL
  Filled 2016-01-31: qty 1

## 2016-01-31 NOTE — ED Notes (Signed)
Pt states she starting having chest pains on Saturday and today at work her arms started to feel heavy with the chest pains coming on again. Pt denies SOB, N/V, with no radiation of pain. Pt rated the pain at that time at a 7. Pt's current pain level is a 1 and her arms just still feel heavy.

## 2016-01-31 NOTE — ED Notes (Signed)
I made MD Molpus aware of the I stat Troponin

## 2016-01-31 NOTE — Progress Notes (Signed)
ANTICOAGULATION CONSULT NOTE - Initial Consult  Pharmacy Consult for IV heparin Indication: NSTEMI  No Known Allergies  Patient Measurements: Height: 5\' 3"  (160 cm) Weight: 170 lb (77.1 kg) IBW/kg (Calculated) : 52.4 Heparin Dosing Weight: 69kg  Vital Signs: Temp: 97.8 F (36.6 C) (08/21 1922) Temp Source: Oral (08/21 1922) BP: 136/96 (08/21 1922) Pulse Rate: 75 (08/21 1922)  Labs:  Recent Labs  01/31/16 1712  HGB 14.0  HCT 42.0  PLT 276  CREATININE 0.59    Estimated Creatinine Clearance: 78.1 mL/min (by C-G formula based on SCr of 0.8 mg/dL).   Medical History: Past Medical History:  Diagnosis Date  . Cat allergies   . Diverticulosis 04/2011  . Toenail fungus     Assessment: 89 yoF with NSTEMI with plan for possible cardiac catheterization in AM.  Pharmacy consulted to start IV heparin.  No anticoagulants or antiplatelets PTA.  No issues with CBC.  CrCl >30 ml/min.    Goal of Therapy:  Heparin level 0.3-0.7 units/ml Monitor platelets by anticoagulation protocol: Yes   Plan:  Heparin bolus 4000 units IV x1, then start IV heparin at 800 units/hr.   STAT PT/INR, aPTT F/u baseline coags, heparin level in 6 hours  Haynes Hoehn, PharmD, BCPS 01/31/2016, 7:57 PM  Pager: [phone]

## 2016-01-31 NOTE — ED Notes (Signed)
Called 2w mc ( nurses in report ) 15 min call back

## 2016-01-31 NOTE — ED Provider Notes (Signed)
[facility]-EMERGENCY DEPT Provider Note   CSN: [id number] Arrival date & time: 01/31/16  1648     History   Chief Complaint Chief Complaint  Patient presents with  . Chest Pain    HPI Leslie Roberts is a 55 y.o. female who developed chest pain while walking 2 days ago. She describes the pain as a dull pain in the precordium. It did not radiate. There was associated diaphoresis but no associated shortness of breath or nausea. The pain was an 8 out of 10 at its worst but resolved with rest. She had another episode of pain this morning which she rated as a 5 out of 10. She denies pain at the present time. Although her EKG did not show an acute MI her troponin, drawn per protocol, was elevated at 0.23.  HPI  Past Medical History:  Diagnosis Date  . Cat allergies   . Diverticulosis 04/2011  . Toenail fungus     There are no active problems to display for this patient.   Past Surgical History:  Procedure Laterality Date  . bilateral breast reduction  1982  . ENDOMETRIAL BIOPSY  10/2009   benign    OB History    Gravida Para Term Preterm AB Living   2 0     2 0   SAB TAB Ectopic Multiple Live Births     2             Home Medications    Prior to Admission medications   Medication Sig Start Date End Date Taking? Authorizing Provider  B Complex Vitamins (VITAMIN B COMPLEX) CAPS Take by mouth daily.      Historical Provider, MD  Calcium Carbonate-Vitamin D (CALCIUM + D PO) Take 500 mg by mouth daily.     Historical Provider, MD  Cetirizine HCl (ZYRTEC PO) Take by mouth daily.    Historical Provider, MD  fish oil-omega-3 fatty acids 1000 MG capsule Take 2 g by mouth daily.    Historical Provider, MD  magic mouthwash w/lidocaine SOLN Take 5 mLs by mouth 3 (three) times daily as needed for mouth pain. 05/24/15   Shirline Freesory Nafziger, NP  Multiple Vitamin (MULTIVITAMIN) tablet Take 1 tablet by mouth daily.      Historical Provider, MD  vitamin C (ASCORBIC ACID) 500 MG tablet Take 500 mg  by mouth daily.      Historical Provider, MD    Family History Family History  Problem Relation Age of Onset  . Hypertension Father   . Heart disease Father   . Diabetes Father   . Cancer Mother   . Thyroid disease Mother     hyperthyroid  . Thyroid disease Sister     hypothyroid  . Colon cancer Neg Hx 79    Dec cancer unknown origin  . Esophageal cancer Neg Hx   . Stomach cancer Neg Hx     Social History Social History  Substance Use Topics  . Smoking status: Never Smoker  . Smokeless tobacco: Never Used  . Alcohol use 2.4 - 3.6 oz/week    4 - 6 Glasses of wine per week     Comment: 4-6 drinks a week (wine or beer)     Allergies   Review of patient's allergies indicates no known allergies.   Review of Systems Review of Systems  All other systems reviewed and are negative.   Physical Exam Updated Vital Signs BP (!) 153/104 (BP Location: Left Arm)   Pulse 86   Temp 98.4  F (36.9 C) (Oral)   Resp 13   Ht 5\' 3"  (1.6 m)   Wt 170 lb (77.1 kg)   LMP 06/12/2009   SpO2 94%   BMI 30.11 kg/m   Physical Exam General: Well-developed, well-nourished female in no acute distress; appearance consistent with age of record HENT: normocephalic; atraumatic Eyes: pupils equal, round and reactive to light; extraocular muscles intact Neck: supple Heart: regular rate and rhythm; no murmurs, rubs or gallops Lungs: clear to auscultation bilaterally Abdomen: soft; nondistended; nontender; no masses or hepatosplenomegaly; bowel sounds present Extremities: No deformity; full range of motion; pulses normal Neurologic: Awake, alert and oriented; motor function intact in all extremities and symmetric; no facial droop Skin: Warm and dry Psychiatric: Normal mood and affect    ED Treatments / Results   Nursing notes and vitals signs, including pulse oximetry, reviewed.  Summary of this visit's results, reviewed by myself:   EKG Interpretation  Date/Time:  Monday January 31 2016 16:57:23 EDT Ventricular Rate:  90 PR Interval:    QRS Duration: 97 QT Interval:  347 QTC Calculation: 425 R Axis:   61 Text Interpretation:  Sinus rhythm Probable left atrial enlargement Low voltage, precordial leads RSR' in V1 or V2, right VCD or RVH No previous ECGs available Confirmed by Nina Mondor  MD, Jonny Ruiz ([id number]) on 01/31/2016 5:59:44 PM       Labs:  Results for orders placed or performed during the hospital encounter of 01/31/16 (from the past 24 hour(s))  Basic metabolic panel     Status: None   Collection Time: 01/31/16  5:12 PM  Result Value Ref Range   Sodium 138 135 - 145 mmol/L   Potassium 3.5 3.5 - 5.1 mmol/L   Chloride 105 101 - 111 mmol/L   CO2 25 22 - 32 mmol/L   Glucose, Bld 93 65 - 99 mg/dL   BUN 17 6 - 20 mg/dL   Creatinine, Ser 6.04 0.44 - 1.00 mg/dL   Calcium 9.4 8.9 - 54.0 mg/dL   GFR calc non Af Amer >60 >60 mL/min   GFR calc Af Amer >60 >60 mL/min   Anion gap 8 5 - 15  CBC     Status: None   Collection Time: 01/31/16  5:12 PM  Result Value Ref Range   WBC 8.1 4.0 - 10.5 K/uL   RBC 4.52 3.87 - 5.11 MIL/uL   Hemoglobin 14.0 12.0 - 15.0 g/dL   HCT 98.1 19.1 - 47.8 %   MCV 92.9 78.0 - 100.0 fL   MCH 31.0 26.0 - 34.0 pg   MCHC 33.3 30.0 - 36.0 g/dL   RDW 29.5 62.1 - 30.8 %   Platelets 276 150 - 400 K/uL  I-stat troponin, ED     Status: Abnormal   Collection Time: 01/31/16  5:19 PM  Result Value Ref Range   Troponin i, poc 0.23 (HH) 0.00 - 0.08 ng/mL   Comment NOTIFIED PHYSICIAN    Comment 3            Imaging Studies: Dg Chest 2 View  Result Date: 01/31/2016 CLINICAL DATA:  Chest pain EXAM: CHEST  2 VIEW COMPARISON:  None. FINDINGS: Normal heart size. Normal mediastinal contour. No pneumothorax. No pleural effusion. Lungs appear clear, with no acute consolidative airspace disease and no pulmonary edema. IMPRESSION: No active cardiopulmonary disease. Electronically Signed   By: Delbert Phenix M.D.   On: 01/31/2016 17:23    Procedures (including  critical care time)   Final Clinical  Impressions(s) / ED Diagnoses   Final diagnoses:  NSTEMI (non-ST elevated myocardial infarction) [facility])      Paula Libra, MD 01/31/16 [phone]

## 2016-01-31 NOTE — ED Triage Notes (Signed)
Pt here with chest pain since Saturday. No cough/fever.  Pt denies shortness of breath or arm radiation.  Pt with some hx of reflux but does not take meds for it.  Denies increased physical exertion.

## 2016-01-31 NOTE — H&P (Signed)
History & Physical    Patient ID: Leslie Roberts MRN: [id number], DOB/AGE: 07/11/1960   Admit date: 01/31/2016  Primary Physician: No primary care provider on file. Primary Cardiologist: New - Dr. Anne Fu  History of Present Illness    Leslie Roberts is a 55 y.o. female with past medical history of diverticulosis and no prior cardiac history who presented to [facility] ED on 01/31/2016 for evaluation of chest pain.   She initially developed chest pain two days ago while walking which was located along her sternum and left pectoral region and associated with diaphoresis. The pain resolved but represented this morning. Associated symptoms include her arms feeling "heavy". Felt clammy.  No nausea, vomiting, or dyspnea.   No personal history of CAD, HTN, HLD, or Type 2 DM. No prior tobacco use. Does have a family history of HTN, CAD, and Type 2 DM.   Father had heart disease at later age but no early MI.   Felt mild right calf discomfort over past few months when walking. No plane flights or prolonged sedentary state.   She works as a Pensions consultant. Stressful job. Had company physical recently and was OK.   Here with husband.   While in the ED, initial labs show a WBC of 8.1, Hgb 14.0, and platelets 276. Electrolytes within normal limits. Creatinine stable at 0.59. Initial troponin elevated to 0.23. EKG shows NSR, HR 90, with TWI in V1, V2, and V3 (no prior tracings available for comparison). CXR with no active cardiopulmonary disease.   Past Medical History    Past Medical History:  Diagnosis Date  . Cat allergies   . Diverticulosis 04/2011  . Toenail fungus     Past Surgical History:  Procedure Laterality Date  . bilateral breast reduction  1982  . ENDOMETRIAL BIOPSY  10/2009   benign     Allergies: No Known Allergies   Home Medications    Prior to Admission medications   Medication Sig Start Date End Date Taking? Authorizing Provider  B Complex Vitamins (VITAMIN B COMPLEX) CAPS  Take by mouth daily.      Historical Provider, MD  Calcium Carbonate-Vitamin D (CALCIUM + D PO) Take 500 mg by mouth daily.     Historical Provider, MD  Cetirizine HCl (ZYRTEC PO) Take by mouth daily.    Historical Provider, MD  fish oil-omega-3 fatty acids 1000 MG capsule Take 2 g by mouth daily.    Historical Provider, MD  magic mouthwash w/lidocaine SOLN Take 5 mLs by mouth 3 (three) times daily as needed for mouth pain. 05/24/15   Shirline Frees, NP  Multiple Vitamin (MULTIVITAMIN) tablet Take 1 tablet by mouth daily.      Historical Provider, MD  vitamin C (ASCORBIC ACID) 500 MG tablet Take 500 mg by mouth daily.      Historical Provider, MD    Family History    Family History  Problem Relation Age of Onset  . Hypertension Father   . Heart disease Father   . Diabetes Father   . Cancer Mother   . Thyroid disease Mother     hyperthyroid  . Thyroid disease Sister     hypothyroid  . Colon cancer Neg Hx 79    Dec cancer unknown origin  . Esophageal cancer Neg Hx   . Stomach cancer Neg Hx     Social History    Social History   Social History  . Marital status: Married    Spouse name: N/A  . Number  of children: N/A  . Years of education: N/A   Occupational History  . Not on file.   Social History Main Topics  . Smoking status: Never Smoker  . Smokeless tobacco: Never Used  . Alcohol use 2.4 - 3.6 oz/week    4 - 6 Glasses of wine per week     Comment: 4-6 drinks a week (wine or beer)  . Drug use: No  . Sexual activity: Yes    Partners: Male    Birth control/ protection: Post-menopausal   Other Topics Concern  . Not on file   Social History Narrative  . No narrative on file     Review of Systems    General:  No chills, fever, night sweats or weight changes.  Cardiovascular:  No dyspnea on exertion, edema, orthopnea, palpitations, paroxysmal nocturnal dyspnea. Positive for chest pain. Dermatological: No rash, lesions/masses Respiratory: No cough,  dyspnea Urologic: No hematuria, dysuria Abdominal:   No nausea, vomiting, diarrhea, bright red blood per rectum, melena, or hematemesis Neurologic:  No visual changes, wkns, changes in mental status. All other systems reviewed and are otherwise negative except as noted above.  Physical Exam    Blood pressure 137/85, pulse 112, temperature 98.4 F (36.9 C), temperature source Oral, resp. rate 18, height 5\' 3"  (1.6 m), weight 170 lb (77.1 kg), last menstrual period 06/12/2009, SpO2 92 %.  General: Well developed, well nourished,female appearing in no acute distress. Head: Normocephalic, atraumatic, sclera non-icteric, no xanthomas, nares are without discharge. Dentition:  Neck: No carotid bruits. JVD not elevated.  Lungs: Respirations regular and unlabored, without wheezes or rales.  Heart: Regular rate and rhythm. No S3 or S4.  No murmur, no rubs, or gallops appreciated. Abdomen: Soft, non-tender, non-distended with normoactive bowel sounds. No hepatomegaly. No rebound/guarding. No obvious abdominal masses. Msk:  Strength and tone appear normal for age. No joint deformities or effusions. Extremities: No clubbing or cyanosis. No edema.  Distal pedal pulses are 2+ bilaterally. Neuro: Alert and oriented X 3. Moves all extremities spontaneously. No focal deficits noted. Psych:  Responds to questions appropriately with a normal affect. Skin: No rashes or lesions noted  Labs    Troponin (Point of Care Test)  Recent Labs  01/31/16 1719  TROPIPOC 0.23*   No results for input(s): CKTOTAL, CKMB, TROPONINI in the last 72 hours. Lab Results  Component Value Date   WBC 8.1 01/31/2016   HGB 14.0 01/31/2016   HCT 42.0 01/31/2016   MCV 92.9 01/31/2016   PLT 276 01/31/2016    Recent Labs Lab 01/31/16 1712  NA 138  K 3.5  CL 105  CO2 25  BUN 17  CREATININE 0.59  CALCIUM 9.4  GLUCOSE 93   Lab Results  Component Value Date   CHOL 195 04/16/2015   HDL 50 04/16/2015   LDLCALC 102  04/16/2015   TRIG 216 (H) 04/16/2015     Radiology Studies    Dg Chest 2 View: Result Date: 01/31/2016 CLINICAL DATA:  Chest pain EXAM: CHEST  2 VIEW COMPARISON:  None. FINDINGS: Normal heart size. Normal mediastinal contour. No pneumothorax. No pleural effusion. Lungs appear clear, with no acute consolidative airspace disease and no pulmonary edema. IMPRESSION: No active cardiopulmonary disease. Electronically Signed   By: Delbert PhenixJason A Poff M.D.   On: 01/31/2016 17:23    EKG & Cardiac Imaging    EKG: NSR, HR 90, with TWI in V1, V2, and V3 (No prior tracings available for comparison).   ECHOCARDIOGRAM: None on  File  Assessment & Plan    1. Non-STEMI (non-ST elevated myocardial infarction) (HCC) - no prior history of CAD or known history of HTN, HLD or Type 2 DM. Non smoker. Reported initial onset of chest pain two days ago which occurred with exertion, subsided with rest. . Developed recurrent pain this AM associated with heaviness along her upper extremities.  - initial troponin elevated to 0.23. Will cycle values. EKG shows NSR, HR 90, with TWI in V1, V2, and V3 (no prior tracings available for comparison). - will check Lipid Panel and A1c for risk stratification.  - start Heparin per Pharmacy consult. Start ASA 81mg  daily and Lopressor 12.5mg  BID. Statin. - Cardiac catheterization tomorrow afternoon. Risks and benefits including stroke, death, bleeding, renal, CABG discussed. Willing to proceed. Clear liquid breakfast in AM then NPO.   Signed, Donato Schultz, MD   01/31/2016, 7:13 PM

## 2016-01-31 NOTE — Progress Notes (Signed)
CRITICAL VALUE ALERT  Critical value received:  Troponin 0.38  Date of notification:  01-31-16  Time of notification:  11:24pm  Critical value read back:Yes.    Nurse who received alert:  Lazaro Arms  MD notified (1st page):  Card fellow; Hochrein  Time of first page:  01-31-16   Responding MD:  Antoine Poche  Time MD responded:  Medical team aware no further orders at this time given

## 2016-02-01 ENCOUNTER — Encounter (HOSPITAL_COMMUNITY)
Admission: EM | Disposition: A | Discharge status: Home / Self Care | Payer: Self-pay | Source: Home / Self Care | Attending: Cardiology

## 2016-02-01 ENCOUNTER — Encounter (HOSPITAL_COMMUNITY): Payer: Self-pay | Admitting: Internal Medicine

## 2016-02-01 ENCOUNTER — Other Ambulatory Visit (HOSPITAL_COMMUNITY): Payer: BLUE CROSS/BLUE SHIELD

## 2016-02-01 DIAGNOSIS — E785 Hyperlipidemia, unspecified: Secondary | ICD-10-CM

## 2016-02-01 DIAGNOSIS — I2510 Atherosclerotic heart disease of native coronary artery without angina pectoris: Secondary | ICD-10-CM

## 2016-02-01 DIAGNOSIS — I2542 Coronary artery dissection: Secondary | ICD-10-CM

## 2016-02-01 DIAGNOSIS — I10 Essential (primary) hypertension: Secondary | ICD-10-CM

## 2016-02-01 HISTORY — PX: CARDIAC CATHETERIZATION: SHX172

## 2016-02-01 LAB — CBC
HEMATOCRIT: 40.1 % (ref 36.0–46.0)
HEMOGLOBIN: 12.9 g/dL (ref 12.0–15.0)
MCH: 30.6 pg (ref 26.0–34.0)
MCHC: 32.2 g/dL (ref 30.0–36.0)
MCV: 95.2 fL (ref 78.0–100.0)
Platelets: 255 10*3/uL (ref 150–400)
RBC: 4.21 MIL/uL (ref 3.87–5.11)
RDW: 12.6 % (ref 11.5–15.5)
WBC: 7 10*3/uL (ref 4.0–10.5)

## 2016-02-01 LAB — LIPID PANEL
Cholesterol: 169 mg/dL (ref 0–200)
HDL: 48 mg/dL (ref >40)
LDL Cholesterol: 102 mg/dL — ABNORMAL HIGH (ref 0–99)
Total CHOL/HDL Ratio: 3.5 RATIO
Triglycerides: 95 mg/dL (ref <150)
VLDL: 19 mg/dL (ref 0–40)

## 2016-02-01 LAB — PROTIME-INR
INR: 1.04
Prothrombin Time: 13.6 seconds (ref 11.4–15.2)

## 2016-02-01 LAB — BASIC METABOLIC PANEL
Anion gap: 6 (ref 5–15)
BUN: 11 mg/dL (ref 6–20)
CO2: 26 mmol/L (ref 22–32)
Calcium: 8.9 mg/dL (ref 8.9–10.3)
Chloride: 109 mmol/L (ref 101–111)
Creatinine, Ser: 0.65 mg/dL (ref 0.44–1.00)
GFR calc Af Amer: >60 mL/min (ref >60)
GFR calc non Af Amer: >60 mL/min (ref >60)
Glucose, Bld: 103 mg/dL — ABNORMAL HIGH (ref 65–99)
Potassium: 3.8 mmol/L (ref 3.5–5.1)
Sodium: 141 mmol/L (ref 135–145)

## 2016-02-01 LAB — TROPONIN I
TROPONIN I: 0.29 ng/mL — AB (ref <0.03)
Troponin I: 0.39 ng/mL (ref <0.03)

## 2016-02-01 LAB — HEPARIN LEVEL (UNFRACTIONATED): Heparin Unfractionated: 0.37 IU/mL (ref 0.30–0.70)

## 2016-02-01 SURGERY — LEFT HEART CATH AND CORONARY ANGIOGRAPHY
Anesthesia: LOCAL

## 2016-02-01 MED ORDER — HEPARIN SODIUM (PORCINE) 1000 UNIT/ML IJ SOLN
INTRAMUSCULAR | Status: AC
  Filled 2016-02-01: qty 1

## 2016-02-01 MED ORDER — MIDAZOLAM HCL 2 MG/2ML IJ SOLN
INTRAMUSCULAR | Status: AC
  Filled 2016-02-01: qty 2

## 2016-02-01 MED ORDER — FENTANYL CITRATE (PF) 100 MCG/2ML IJ SOLN
INTRAMUSCULAR | Status: AC
  Filled 2016-02-01: qty 2

## 2016-02-01 MED ORDER — ISOSORBIDE MONONITRATE ER 30 MG PO TB24
15.0000 mg | ORAL_TABLET | Freq: Every day | ORAL | Status: DC
  Administered 2016-02-01: 15 mg via ORAL
  Filled 2016-02-01 (×2): qty 1

## 2016-02-01 MED ORDER — VERAPAMIL HCL 2.5 MG/ML IV SOLN
INTRAVENOUS | Status: AC
Start: 2016-02-01 — End: 2016-02-01
  Filled 2016-02-01: qty 2

## 2016-02-01 MED ORDER — SODIUM CHLORIDE 0.9 % IV SOLN: INTRAVENOUS | Status: AC

## 2016-02-01 MED ORDER — LIDOCAINE HCL (PF) 1 % IJ SOLN
INTRAMUSCULAR | Status: DC | PRN
  Administered 2016-02-01: 1 mL

## 2016-02-01 MED ORDER — SODIUM CHLORIDE 0.9 % IV SOLN: 250.0000 mL | INTRAVENOUS | Status: DC | PRN

## 2016-02-01 MED ORDER — LIDOCAINE HCL (PF) 1 % IJ SOLN
INTRAMUSCULAR | Status: AC
  Filled 2016-02-01: qty 30

## 2016-02-01 MED ORDER — TRAMADOL HCL 50 MG PO TABS
50.0000 mg | ORAL_TABLET | Freq: Once | ORAL | Status: AC
  Administered 2016-02-01: 50 mg via ORAL
  Filled 2016-02-01: qty 1

## 2016-02-01 MED ORDER — SODIUM CHLORIDE 0.9% FLUSH
3.0000 mL | Freq: Two times a day (BID) | INTRAVENOUS | Status: DC
Start: 2016-02-01 — End: 2016-02-02
  Administered 2016-02-01: 3 mL via INTRAVENOUS

## 2016-02-01 MED ORDER — MIDAZOLAM HCL 2 MG/2ML IJ SOLN
INTRAMUSCULAR | Status: DC | PRN
  Administered 2016-02-01: 1 mg via INTRAVENOUS

## 2016-02-01 MED ORDER — HEPARIN (PORCINE) IN NACL 2-0.9 UNIT/ML-% IJ SOLN
INTRAMUSCULAR | Status: AC
  Filled 2016-02-01: qty 500

## 2016-02-01 MED ORDER — FENTANYL CITRATE (PF) 100 MCG/2ML IJ SOLN
INTRAMUSCULAR | Status: DC | PRN
  Administered 2016-02-01: 50 ug via INTRAVENOUS

## 2016-02-01 MED ORDER — VERAPAMIL HCL 2.5 MG/ML IV SOLN
INTRAVENOUS | Status: DC | PRN
  Administered 2016-02-01: 10:00:00 via INTRA_ARTERIAL

## 2016-02-01 MED ORDER — HEPARIN (PORCINE) IN NACL 2-0.9 UNIT/ML-% IJ SOLN
INTRAMUSCULAR | Status: AC
  Filled 2016-02-01: qty 1000

## 2016-02-01 MED ORDER — IOPAMIDOL (ISOVUE-370) INJECTION 76%
INTRAVENOUS | Status: AC
  Filled 2016-02-01: qty 100

## 2016-02-01 MED ORDER — HEPARIN SODIUM (PORCINE) 1000 UNIT/ML IJ SOLN
INTRAMUSCULAR | Status: DC | PRN
Start: 2016-02-01 — End: 2016-02-01
  Administered 2016-02-01: 3500 [IU] via INTRAVENOUS

## 2016-02-01 MED ORDER — SODIUM CHLORIDE 0.9% FLUSH: 3.0000 mL | INTRAVENOUS | Status: DC | PRN

## 2016-02-01 MED ORDER — HEPARIN (PORCINE) IN NACL 2-0.9 UNIT/ML-% IJ SOLN
INTRAMUSCULAR | Status: DC | PRN
  Administered 2016-02-01: 1500 mL

## 2016-02-01 MED ORDER — IOPAMIDOL (ISOVUE-370) INJECTION 76%
INTRAVENOUS | Status: DC | PRN
  Administered 2016-02-01: 70 mL via INTRA_ARTERIAL

## 2016-02-01 SURGICAL SUPPLY — 11 items
CATH INFINITI 5 FR JL3.5 (CATHETERS) ×1 IMPLANT
CATH INFINITI 5FR ANG PIGTAIL (CATHETERS) ×1 IMPLANT
CATH INFINITI JR4 5F (CATHETERS) ×1 IMPLANT
DEVICE RAD COMP TR BAND LRG (VASCULAR PRODUCTS) ×2 IMPLANT
GLIDESHEATH SLEND SS 6F .021 (SHEATH) ×1 IMPLANT
KIT HEART LEFT (KITS) ×2 IMPLANT
PACK CARDIAC CATHETERIZATION (CUSTOM PROCEDURE TRAY) ×2 IMPLANT
SYR MEDRAD MARK V 150ML (SYRINGE) ×2 IMPLANT
TRANSDUCER W/STOPCOCK (MISCELLANEOUS) ×2 IMPLANT
TUBING CIL FLEX 10 FLL-RA (TUBING) ×2 IMPLANT
WIRE SAFE-T 1.5MM-J .035X260CM (WIRE) ×1 IMPLANT

## 2016-02-01 NOTE — Progress Notes (Addendum)
Patient Name: Leslie Roberts Date of Encounter: 02/01/2016  Active Problems:   Non-STEMI (non-ST elevated myocardial infarction) [facility])   NSTEMI (non-ST elevated myocardial infarction) (HCC)   Length of Stay: 2  SUBJECTIVE  No chest pain.   CURRENT MEDS . [MAR Hold] aspirin EC  81 mg Oral Daily  . [MAR Hold] atorvastatin  80 mg Oral q1800  . [facility] Hold] metoprolol tartrate  12.5 mg Oral BID  . sodium chloride flush  3 mL Intravenous Q12H   OBJECTIVE  Vitals:   02/01/16 1042 02/01/16 1047 02/01/16 1052 02/01/16 1057  BP: (!) 128/92 (!) 133/92 132/90 (!) 133/93  Pulse: 76 81 71 69  Resp: 11 14 20 20   Temp:      TempSrc:      SpO2: 98% 98% 99% 99%  Weight:      Height:       No intake or output data in the 24 hours ending 02/01/16 1131 Filed Weights   01/31/16 1656 01/31/16 2232 02/01/16 0445  Weight: 170 lb (77.1 kg) 162 lb (73.5 kg) 163 lb 3.2 oz (74 kg)    PHYSICAL EXAM  General: Pleasant, NAD. Neuro: Alert and oriented X 3. Moves all extremities spontaneously. Psych: Normal affect. HEENT:  Normal  Neck: Supple without bruits or JVD. Lungs:  Resp regular and unlabored, CTA. Heart: RRR no s3, s4, or murmurs. Abdomen: Soft, non-tender, non-distended, BS + x 4.  Extremities: No clubbing, cyanosis or edema. DP/PT/Radials 2+ and equal bilaterally.  Accessory Clinical Findings  CBC  Recent Labs  01/31/16 1712 02/01/16 0402  WBC 8.1 7.0  HGB 14.0 12.9  HCT 42.0 40.1  MCV 92.9 95.2  PLT 276 255   Basic Metabolic Panel  Recent Labs  01/31/16 1712 02/01/16 0402  NA 138 141  K 3.5 3.8  CL 105 109  CO2 25 26  GLUCOSE 93 103*  BUN 17 11  CREATININE 0.59 0.65  CALCIUM 9.4 8.9    Recent Labs  01/31/16 2233 02/01/16 0402  TROPONINI 0.38* 0.39*    Recent Labs  02/01/16 0402  CHOL 169  HDL 48  LDLCALC 102*  TRIG 95  CHOLHDL 3.5   Radiology/Studies  Dg Chest 2 View  Result Date: 01/31/2016 CLINICAL DATA:  Chest pain EXAM: CHEST  2  VIEW COMPARISON:  None. FINDINGS: Normal heart size. Normal mediastinal contour. No pneumothorax. No pleural effusion. Lungs appear clear, with no acute consolidative airspace disease and no pulmonary edema. IMPRESSION: No active cardiopulmonary disease. Electronically Signed   By: Delbert Phenix M.D.   On: 01/31/2016 17:23   LHC: 02/01/2016  The left ventricular systolic function is normal.  LV end diastolic pressure is normal.  The left ventricular ejection fraction is 55-65% by visual estimate.  Lat 1st Diag lesion, 90 %stenosed.   1. Angiographically normal RCA and left circumflex 2. Angiographically normal proximal LAD with distal tapering of the diagonal branch suspicious for spontaneous coronary artery dissection 3. Normal LV function with subtle apical hypokinesis  Medical therapy, echo, and carotid/renal artery duplex to evaluate for other vascular anomalies such as FMD.    ASSESSMENT AND PLAN  55 year old female who presented with non-STEMI  1. Non-STEMI troponin 0.39, we will repeat until it started trending, she underwent left cardiac catheterization today with findings of angiographically normal RCA and left circumflex artery and proximal LAD however distal is tapering of the diagonal branch suspicious for spontaneous coronary artery dissection. LVEF overall appears preserved with subtle suggestion for hypokinesis  in the apical region. Medical therapy was recommended. We will order an echocardiogram to confirm. We will also order carotid and renal artery duplex to evaluate for other vascular and amylase such as FMD.  - We'll continue aspirin, add Plavix as this was non-STEMI, continue metoprolol and atorvastatin.  - add imdur 15 mg po daily  2. Essential hypertension - add Imdur 15 mg daily.  3. Hyperlipidemia - LDL 102 borderline however considering she just had non-STEMI we'll start low-dose atorvastatin 10 mg daily.  Anticipated discharge  tomorrow.  Signed, Tobias AlexanderKatarina Luz Mares MD, [facility] 02/01/2016

## 2016-02-01 NOTE — Interval H&P Note (Signed)
History and Physical Interval Note:  02/01/2016 9:46 AM  Leslie Roberts  has presented today for cardiac catheterization, with the diagnosis of NSTEMI  The various methods of treatment have been discussed with the patient and family. After consideration of risks, benefits and other options for treatment, the patient has consented to  Procedure(s): Left Heart Cath and Coronary Angiography (N/A) as a surgical intervention .  The patient's history has been reviewed, patient examined, no change in status, stable for surgery.  I have reviewed the patient's chart and labs.  Questions were answered to the patient's satisfaction.    Cath Lab Visit (complete for each Cath Lab visit)  Clinical Evaluation Leading to the Procedure:   ACS: Yes.    Non-ACS:    Anginal Classification: CCS IV  Anti-ischemic medical therapy: No Therapy  Non-Invasive Test Results: No non-invasive testing performed  Prior CABG: No previous CABG   [name]

## 2016-02-01 NOTE — Progress Notes (Signed)
ANTICOAGULATION CONSULT NOTE Pharmacy Consult for IV heparin Indication: NSTEMI  No Known Allergies  Patient Measurements: Height: 5\' 3"  (160 cm) Weight: 162 lb (73.5 kg) IBW/kg (Calculated) : 52.4 Heparin Dosing Weight: 69kg  Vital Signs: Temp: 97.8 F (36.6 C) (08/21 1922) Temp Source: Oral (08/21 1922) BP: 138/85 (08/21 2241) Pulse Rate: 69 (08/21 2241)  Labs:  Recent Labs  01/31/16 1712 01/31/16 1956 01/31/16 2233 02/01/16 0402  HGB 14.0  --   --  12.9  HCT 42.0  --   --  40.1  PLT 276  --   --  255  APTT  --  31  --   --   LABPROT  --  12.7  --  13.6  INR  --  0.95  --  1.04  HEPARINUNFRC  --   --   --  0.37  CREATININE 0.59  --   --  0.65  TROPONINI  --   --  0.38* 0.39*    Estimated Creatinine Clearance: 76.3 mL/min (by C-G formula based on SCr of 0.8 mg/dL).  Assessment: 55 y.o. female with chest pain for heparin .    Goal of Therapy:  Heparin level 0.3-0.7 units/ml Monitor platelets by anticoagulation protocol: Yes   Plan:  Continue Heparin at current rate  F/U after cath  Geannie Risen, PharmD, BCPS

## 2016-02-02 ENCOUNTER — Inpatient Hospital Stay (HOSPITAL_COMMUNITY): Payer: BLUE CROSS/BLUE SHIELD

## 2016-02-02 ENCOUNTER — Encounter (HOSPITAL_COMMUNITY): Payer: Self-pay | Admitting: Student

## 2016-02-02 DIAGNOSIS — E785 Hyperlipidemia, unspecified: Secondary | ICD-10-CM

## 2016-02-02 DIAGNOSIS — R079 Chest pain, unspecified: Secondary | ICD-10-CM

## 2016-02-02 DIAGNOSIS — R110 Nausea: Secondary | ICD-10-CM

## 2016-02-02 DIAGNOSIS — I214 Non-ST elevation (NSTEMI) myocardial infarction: Secondary | ICD-10-CM

## 2016-02-02 DIAGNOSIS — I10 Essential (primary) hypertension: Secondary | ICD-10-CM

## 2016-02-02 DIAGNOSIS — I2542 Coronary artery dissection: Secondary | ICD-10-CM

## 2016-02-02 LAB — CBC
HEMATOCRIT: 39.4 % (ref 36.0–46.0)
HEMOGLOBIN: 12.4 g/dL (ref 12.0–15.0)
MCH: 30.2 pg (ref 26.0–34.0)
MCHC: 31.5 g/dL (ref 30.0–36.0)
MCV: 96.1 fL (ref 78.0–100.0)
Platelets: 255 10*3/uL (ref 150–400)
RBC: 4.1 MIL/uL (ref 3.87–5.11)
RDW: 12.7 % (ref 11.5–15.5)
WBC: 7.8 10*3/uL (ref 4.0–10.5)

## 2016-02-02 LAB — VAS US CAROTID
LCCADDIAS: -20 cm/s
LCCAPSYS: 91 cm/s
LEFT ECA DIAS: 10 cm/s
LEFT VERTEBRAL DIAS: 22 cm/s
LICADSYS: -47 cm/s
Left CCA dist sys: -81 cm/s
Left CCA prox dias: 21 cm/s
Left ICA dist dias: -20 cm/s
Left ICA prox dias: -18 cm/s
Left ICA prox sys: -52 cm/s
RCCADSYS: -83 cm/s
RCCAPDIAS: 14 cm/s
RIGHT ECA DIAS: 11 cm/s
RIGHT VERTEBRAL DIAS: 8 cm/s
Right CCA prox sys: 72 cm/s

## 2016-02-02 LAB — HEMOGLOBIN A1C
HEMOGLOBIN A1C: 5.4 % (ref 4.8–5.6)
Mean Plasma Glucose: 108 mg/dL

## 2016-02-02 LAB — ECHOCARDIOGRAM COMPLETE
HEIGHTINCHES: 63 in
WEIGHTICAEL: 2606.4 [oz_av]

## 2016-02-02 MED ORDER — METOPROLOL TARTRATE 25 MG PO TABS: 12.5000 mg | ORAL_TABLET | Freq: Two times a day (BID) | ORAL | 5 refills | Status: DC

## 2016-02-02 MED ORDER — LISINOPRIL 2.5 MG PO TABS
2.5000 mg | ORAL_TABLET | Freq: Every day | ORAL | Status: DC
  Administered 2016-02-02: 2.5 mg via ORAL
  Filled 2016-02-02: qty 1

## 2016-02-02 MED ORDER — ONDANSETRON HCL 4 MG PO TABS: 4.0000 mg | ORAL_TABLET | Freq: Three times a day (TID) | ORAL | 0 refills | Status: DC | PRN

## 2016-02-02 MED ORDER — ONDANSETRON HCL 4 MG/2ML IJ SOLN: 4.0000 mg | Freq: Once | INTRAMUSCULAR | Status: AC

## 2016-02-02 MED ORDER — ATORVASTATIN CALCIUM 10 MG PO TABS: 10.0000 mg | ORAL_TABLET | Freq: Every day | ORAL | 5 refills | Status: DC

## 2016-02-02 MED ORDER — TRAMADOL HCL 50 MG PO TABS
50.0000 mg | ORAL_TABLET | Freq: Four times a day (QID) | ORAL | Status: DC | PRN
  Administered 2016-02-02: 50 mg via ORAL
  Filled 2016-02-02: qty 1

## 2016-02-02 MED ORDER — ATORVASTATIN CALCIUM 10 MG PO TABS: 10.0000 mg | ORAL_TABLET | Freq: Every day | ORAL | Status: DC

## 2016-02-02 MED ORDER — CLOPIDOGREL BISULFATE 75 MG PO TABS: 75.0000 mg | ORAL_TABLET | Freq: Every day | ORAL | 5 refills | Status: DC

## 2016-02-02 MED ORDER — CLOPIDOGREL BISULFATE 75 MG PO TABS: 75.0000 mg | ORAL_TABLET | Freq: Every day | ORAL | Status: DC

## 2016-02-02 MED ORDER — PROMETHAZINE HCL 25 MG PO TABS
12.5000 mg | ORAL_TABLET | Freq: Four times a day (QID) | ORAL | Status: DC | PRN
  Administered 2016-02-02: 12.5 mg via ORAL
  Filled 2016-02-02: qty 1

## 2016-02-02 MED ORDER — NITROGLYCERIN 0.4 MG SL SUBL: 0.4000 mg | SUBLINGUAL_TABLET | SUBLINGUAL | 3 refills | Status: DC | PRN

## 2016-02-02 MED ORDER — TRAMADOL HCL 50 MG PO TABS: 50.0000 mg | ORAL_TABLET | Freq: Four times a day (QID) | ORAL | Status: DC

## 2016-02-02 MED ORDER — ASPIRIN 81 MG PO TBEC: 81.0000 mg | DELAYED_RELEASE_TABLET | Freq: Every day | ORAL | Status: AC

## 2016-02-02 MED ORDER — LISINOPRIL 2.5 MG PO TABS: 2.5000 mg | ORAL_TABLET | Freq: Every day | ORAL | 5 refills | Status: DC

## 2016-02-02 NOTE — Progress Notes (Signed)
CARDIAC REHAB PHASE I   PRE:  Rate/Rhythm: 73 SR    BP: sitting 124/88    SaO2: 97 RA  MODE:  Ambulation: 550 ft   POST:  Rate/Rhythm: 86 SR    BP: sitting 133/71     SaO2: 97 RA  Tolerated well, no c/o. Denied CP/SOB. Ed completed with good reception. Will send referral to [facility]. Pt has trip planned for Labor Day week driving to [place] with father, encouraged her to talk with MD.  [phone]  Harriet Masson CES, ACSM 02/02/2016 10:28 AM

## 2016-02-02 NOTE — Progress Notes (Signed)
*  PRELIMINARY RESULTS* Vascular Ultrasound Carotid duplex: no evidence of carotid artery stenosis. Elevated velocities noted at mid ICA bilaterally, but appears to be due to tortuosity. Other etiology could be FMD.  Renal artery duplex: no evidence of renal artery stenosis.    Farrel Demark, RDMS, RVT  02/02/2016, 9:06 AM

## 2016-02-02 NOTE — Progress Notes (Addendum)
Patient Name: Leslie Roberts Date of Encounter: 02/02/2016  Active Problems:   Non-STEMI (non-ST elevated myocardial infarction) [facility])   NSTEMI (non-ST elevated myocardial infarction) (HCC)   Length of Stay: 3  SUBJECTIVE  The patient feels nauseous this am, no chest pain. Complains of headaches.  CURRENT MEDS . aspirin EC  81 mg Oral Daily  . isosorbide mononitrate  15 mg Oral Daily  . metoprolol tartrate  12.5 mg Oral BID  . sodium chloride flush  3 mL Intravenous Q12H   OBJECTIVE  Vitals:   02/01/16 1057 02/01/16 1936 02/01/16 2109 02/02/16 0606  BP: (!) 133/93 107/67 103/64 112/73  Pulse: 69 70 68 62  Resp: 20 18  18   Temp:  98.4 F (36.9 C)  97.8 F (36.6 C)  TempSrc:  Oral  Oral  SpO2: 99% 97%  96%  Weight:    162 lb 14.4 oz (73.9 kg)  Height:        Intake/Output Summary (Last 24 hours) at 02/02/16 1047 Last data filed at 02/02/16 0730  Gross per 24 hour  Intake              243 ml  Output                0 ml  Net              243 ml   Filed Weights   01/31/16 2232 02/01/16 0445 02/02/16 0606  Weight: 162 lb (73.5 kg) 163 lb 3.2 oz (74 kg) 162 lb 14.4 oz (73.9 kg)    PHYSICAL EXAM  General: Pleasant, NAD. Neuro: Alert and oriented X 3. Moves all extremities spontaneously. Psych: Normal affect. HEENT:  Normal  Neck: Supple without bruits or JVD. Lungs:  Resp regular and unlabored, CTA. Heart: RRR no s3, s4, or murmurs. Abdomen: Soft, non-tender, non-distended, BS + x 4.  Extremities: No clubbing, cyanosis or edema. DP/PT/Radials 2+ and equal bilaterally.  Accessory Clinical Findings  CBC  Recent Labs  02/01/16 0402 02/02/16 0254  WBC 7.0 7.8  HGB 12.9 12.4  HCT 40.1 39.4  MCV 95.2 96.1  PLT 255 255   Basic Metabolic Panel  Recent Labs  01/31/16 1712 02/01/16 0402  NA 138 141  K 3.5 3.8  CL 105 109  CO2 25 26  GLUCOSE 93 103*  BUN 17 11  CREATININE 0.59 0.65  CALCIUM 9.4 8.9    Recent Labs  01/31/16 2233  02/01/16 0402 02/01/16 1208  TROPONINI 0.38* 0.39* 0.29*    Recent Labs  02/01/16 0402  CHOL 169  HDL 48  LDLCALC 102*  TRIG 95  CHOLHDL 3.5   Radiology/Studies  Dg Chest 2 View  Result Date: 01/31/2016 CLINICAL DATA:  Chest pain EXAM: CHEST  2 VIEW COMPARISON:  None. FINDINGS: Normal heart size. Normal mediastinal contour. No pneumothorax. No pleural effusion. Lungs appear clear, with no acute consolidative airspace disease and no pulmonary edema. IMPRESSION: No active cardiopulmonary disease. Electronically Signed   By: Delbert Phenix M.D.   On: 01/31/2016 17:23   LHC: 02/01/2016  The left ventricular systolic function is normal.  LV end diastolic pressure is normal.  The left ventricular ejection fraction is 55-65% by visual estimate.  Lat 1st Diag lesion, 90 %stenosed.   1. Angiographically normal RCA and left circumflex 2. Angiographically normal proximal LAD with distal tapering of the diagonal branch suspicious for spontaneous coronary artery dissection 3. Normal LV function with subtle apical hypokinesis  Medical therapy, echo,  and carotid/renal artery duplex to evaluate for other vascular anomalies such as FMD.  TTE: 02/02/16 - Left ventricle: The cavity size was normal. Systolic function was   normal. The estimated ejection fraction was in the range of 60%   to 65%. Wall motion was normal; there were no regional wall   motion abnormalities. Doppler parameters are consistent with   abnormal left ventricular relaxation (grade 1 diastolic   dysfunction). There was no evidence of elevated ventricular   filling pressure by Doppler parameters. - Aortic valve: There was mild regurgitation. - Aortic root: The aortic root was normal in size. - Ascending aorta: The ascending aorta was normal in size. - Mitral valve: Structurally normal valve. - Left atrium: The atrium was normal in size. - Right ventricle: The cavity size was normal. Wall thickness was   normal.  Systolic function was normal. - Tricuspid valve: There was mild regurgitation. - Pulmonary arteries: Systolic pressure was within the normal   range. - Inferior vena cava: The vessel was normal in size. - Pericardium, extracardiac: There was no pericardial effusion.    ASSESSMENT AND PLAN  55 year old female who presented with non-STEMI  1. Non-STEMI troponin 0.3 -> 0.29, now chest pain free, TTE showed normal LVEF, no regional WMA, she underwent left cardiac catheterization yesterday with findings of angiographically normal RCA and left circumflex artery and proximal LAD however distal is tapering of the diagonal branch suspicious for spontaneous coronary artery dissection. LVEF overall appears preserved with subtle suggestion for hypokinesis in the apical region. Medical therapy was recommended.  Carotid artery has no plaque, but elevated velocities, differential includes tortuosity vs FMD. We will wait official read.  - We'll continue aspirin, add Plavix as this was non-STEMI, continue metoprolol and atorvastatin.  - d/c imdur 15 mg po daily as it is causing her headaches  2. Essential hypertension - d/c Imdur 15 mg daily, start lisinopril 2.5 mg po daily.  3. Hyperlipidemia - LDL 102 borderline however considering she just had non-STEMI we'll start low-dose atorvastatin 10 mg daily.  4. Nausea - give zofran  Anticipated discharge later today if she feels better. She will need an outpatient follow up within 14 days.  Signed, Tobias AlexanderKatarina Verbie Babic MD, [facility] 02/02/2016

## 2016-02-02 NOTE — Discharge Summary (Signed)
Discharge Summary    Patient ID: Leslie Roberts,  MRN: [id number], DOB/AGE: 1960-07-10 55 y.o.  Admit date: 01/31/2016 Discharge date: 02/02/2016  Primary Care Provider: No primary care provider on file. Primary Cardiologist: Dr. Anne Fu  Discharge Diagnoses    Principal Problem:   Non-STEMI (non-ST elevated myocardial infarction) (HCC) Active Problems:   HLD (hyperlipidemia)   Benign essential HTN   Nausea   History of Present Illness     Leslie Roberts is a 55 y.o. female with past medical history of diverticulosis and no prior cardiac history who presented to [facility] ED on 01/31/2016 for evaluation of chest pain.   She initially developed chest pain two days ago while walking which was located along her sternum and left pectoral region and associated with diaphoresis. The pain resolved but represented the morning of her presentation to the ED. Associated symptoms included her arms feeling "heavy" and feeling "clammy" all over.  No nausea, vomiting, or dyspnea.   No personal history of CAD, HTN, HLD, or Type 2 DM. No prior tobacco use. Does have a family history of HTN, CAD, and Type 2 DM.   While in the ED, initial labs showed a WBC of 8.1, Hgb 14.0, and platelets 276. Electrolytes within normal limits. Creatinine stable at 0.59. Initial troponin elevated to 0.23. EKG showed NSR, HR 90, with TWI in V1, V2, and V3 (no prior tracings available for comparison). CXR with no active cardiopulmonary disease.   With her presenting symptoms and elevated troponin, a cardiac catheterization was recommended. Risks and benefits were reviewed and she agreed to proceed. She was started on Heparin and transferred to [facility] with plans for a catheterization the following day.    Hospital Course     Consultants: None  She denied any repeat episodes of chest pain. Lipid Panel was checked and her LDL was 102, therefore she was started on Atorvastatin 10mg  daily. Her cardiac catheterization  showed a angiographically normal RCA, normal left circumflex, and normal proximal LAD with distal tapering of the diagonal branch with 90% stenosis suspicious for spontaneous coronary artery dissection. Medical therapy was recommended along with carotid and renal artery duplexes to evaluate for other vascular anomalies such as FMD.  The following morning, she denied any repeat episodes of chest discomfort. Did report having a headache and mild nausea. She had been started on Imdur but this was discontinued due to her headache. Was given Zofran and Phenergan for her nausea which improved throughout the day. She ambulated over 550 ft with cardiac rehab without any anginal symptoms.   An echocardiogram was obtained which showed a normal EF of 60% to 65% with no regional wall motion abnormalities. Grade 1 DD noted along with mild AR and no pericardial effusion identified. Renal US showed no evidence of renal artery stenosis. Carotid US showed no significant extracranial carotid artery stenosis but elevated velocities noted at mid ICA bilaterally, but appeared to be due to tortuosity. Other etiology could be FMD.  She was last examined by Dr. Delton See and deemed stable for discharge. She will be started on 81mg  ASA and Plavix due to having an NSTEMI. Will also be on Atorvastatin 10mg  daily, Lopressor 12.5mg  BID and Lisinopril 2.5mg  daily.    _____________  Discharge Vitals Blood pressure 120/73, pulse (!) 57, temperature 97.7 F (36.5 C), temperature source Oral, resp. rate 18, height 5\' 3"  (1.6 m), weight 162 lb 14.4 oz (73.9 kg), last menstrual period 06/12/2009, SpO2 98 %.  Filed Weights  01/31/16 2232 02/01/16 0445 02/02/16 0606  Weight: 162 lb (73.5 kg) 163 lb 3.2 oz (74 kg) 162 lb 14.4 oz (73.9 kg)    Labs & Radiologic Studies     CBC  Recent Labs  02/01/16 0402 02/02/16 0254  WBC 7.0 7.8  HGB 12.9 12.4  HCT 40.1 39.4  MCV 95.2 96.1  PLT 255 255   Basic Metabolic Panel  Recent  Labs  51/02/58 1712 02/01/16 0402  NA 138 141  K 3.5 3.8  CL 105 109  CO2 25 26  GLUCOSE 93 103*  BUN 17 11  CREATININE 0.59 0.65  CALCIUM 9.4 8.9   Liver Function Tests No results for input(s): AST, ALT, ALKPHOS, BILITOT, PROT, ALBUMIN in the last 72 hours. No results for input(s): LIPASE, AMYLASE in the last 72 hours. Cardiac Enzymes  Recent Labs  01/31/16 2233 02/01/16 0402 02/01/16 1208  TROPONINI 0.38* 0.39* 0.29*   BNP Invalid input(s): POCBNP D-Dimer No results for input(s): DDIMER in the last 72 hours. Hemoglobin A1C  Recent Labs  01/31/16 2233  HGBA1C 5.4   Fasting Lipid Panel  Recent Labs  02/01/16 0402  CHOL 169  HDL 48  LDLCALC 102*  TRIG 95  CHOLHDL 3.5   Thyroid Function Tests No results for input(s): TSH, T4TOTAL, T3FREE, THYROIDAB in the last 72 hours.  Invalid input(s): FREET3  Dg Chest 2 View  Result Date: 01/31/2016 CLINICAL DATA:  Chest pain EXAM: CHEST  2 VIEW COMPARISON:  None. FINDINGS: Normal heart size. Normal mediastinal contour. No pneumothorax. No pleural effusion. Lungs appear clear, with no acute consolidative airspace disease and no pulmonary edema. IMPRESSION: No active cardiopulmonary disease. Electronically Signed   By: Delbert Phenix M.D.   On: 01/31/2016 17:23     Diagnostic Studies/Procedures    Cardiac Catheterization: 02/01/2016  The left ventricular systolic function is normal.  LV end diastolic pressure is normal.  The left ventricular ejection fraction is 55-65% by visual estimate.  Lat 1st Diag lesion, 90 %stenosed.   1. Angiographically normal RCA and left circumflex 2. Angiographically normal proximal LAD with distal tapering of the diagonal branch suspicious for spontaneous coronary artery dissection 3. Normal LV function with subtle apical hypokinesis  Medical therapy, echo, and carotid/renal artery duplex to evaluate for other vascular anomalies such as FMD.  Echocardiogram: 02/02/2016 Study  Conclusions  - Left ventricle: The cavity size was normal. Systolic function was   normal. The estimated ejection fraction was in the range of 60%   to 65%. Wall motion was normal; there were no regional wall   motion abnormalities. Doppler parameters are consistent with   abnormal left ventricular relaxation (grade 1 diastolic   dysfunction). There was no evidence of elevated ventricular   filling pressure by Doppler parameters. - Aortic valve: There was mild regurgitation. - Aortic root: The aortic root was normal in size. - Ascending aorta: The ascending aorta was normal in size. - Mitral valve: Structurally normal valve. - Left atrium: The atrium was normal in size. - Right ventricle: The cavity size was normal. Wall thickness was   normal. Systolic function was normal. - Tricuspid valve: There was mild regurgitation. - Pulmonary arteries: Systolic pressure was within the normal   range. - Inferior vena cava: The vessel was normal in size. - Pericardium, extracardiac: There was no pericardial effusion.   Renal Ultrasound: 02/02/2016 Summary: - No evidence of renal artery stenosis noted bilaterally. - Bilateral normal intrarenal resistive indices.  Carotid Ultrasound: 02/02/2016 Summary: -  No significant extracranial carotid artery stenosis demonstrated.   Vertebrals are patent with antegrade flow. - Elevated velocities noted at mid ICA bilaterally, but appears to   be due to tortuosity. Other etiology could be FMD.  Disposition   Pt is being discharged home today in good condition.  Follow-up Plans & Appointments    Follow-up Information    Cline Crock, PA-C Follow up on 02/17/2016.   Specialties:  Cardiology, Radiology Why:  Cardiology Hospital Follow-Up on 02/17/2016 at 3:30PM. Contact information: [address] [place] [address] [phone]          Discharge Instructions    Amb Referral to Cardiac Rehabilitation    Complete by:  As  directed   Diagnosis:  NSTEMI   Diet - low sodium heart healthy    Complete by:  As directed   Discharge instructions    Complete by:  As directed   PLEASE REMEMBER TO BRING ALL OF YOUR MEDICATIONS TO EACH OF YOUR FOLLOW-UP OFFICE VISITS.  PLEASE ATTEND ALL SCHEDULED FOLLOW-UP APPOINTMENTS.   Activity: Increase activity slowly as tolerated. You may shower, but no soaking baths (or swimming) for 1 week. No driving for 1 week. No lifting over 10 lbs for 2 weeks. No sexual activity for 2 weeks.   You May Return to Work: in 1 week (if applicable)  Wound Care: You may wash cath site gently with soap and water. Keep cath site clean and dry. If you notice pain, swelling, bleeding or pus at your cath site, please call [phone].   Increase activity slowly    Complete by:  As directed      Discharge Medications     Medication List    STOP taking these medications   magic mouthwash w/lidocaine Soln     TAKE these medications   aspirin 81 MG EC tablet Take 1 tablet (81 mg total) by mouth daily.   atorvastatin 10 MG tablet Commonly known as:  LIPITOR Take 1 tablet (10 mg total) by mouth daily at 6 PM.   CALCIUM + D PO Take 500 mg by mouth daily.   clopidogrel 75 MG tablet Commonly known as:  PLAVIX Take 1 tablet (75 mg total) by mouth daily. Start taking on:  02/03/2016   fish oil-omega-3 fatty acids 1000 MG capsule Take 2 g by mouth daily.   lisinopril 2.5 MG tablet Commonly known as:  PRINIVIL,ZESTRIL Take 1 tablet (2.5 mg total) by mouth daily.   metoprolol tartrate 25 MG tablet Commonly known as:  LOPRESSOR Take 0.5 tablets (12.5 mg total) by mouth 2 (two) times daily.   multivitamin tablet Take 1 tablet by mouth daily.   nitroGLYCERIN 0.4 MG SL tablet Commonly known as:  NITROSTAT Place 1 tablet (0.4 mg total) under the tongue every 5 (five) minutes x 3 doses as needed for chest pain.   ondansetron 4 MG tablet Commonly known as:  ZOFRAN Take 1 tablet (4 mg  total) by mouth every 8 (eight) hours as needed for nausea or vomiting.   SYSTANE FREE OP Place 1 drop into both eyes daily as needed. For dry eyes   VITAMIN B COMPLEX Caps Take by mouth daily.   vitamin C 500 MG tablet Commonly known as:  ASCORBIC ACID Take 500 mg by mouth daily.   ZYRTEC PO Take by mouth daily.       Aspirin prescribed at discharge?  Yes High Intensity Statin Prescribed? (Lipitor 40-80mg  or Crestor 20-40mg ): No: LDL 102, started  on Atorvastatin 10mg  and titrate as outpatient.  Beta Blocker Prescribed? Yes For EF 40% or less, Was ACEI/ARB Prescribed? No: N/A ADP Receptor Inhibitor Prescribed? (i.e. Plavix etc.-Includes Medically Managed Patients): Yes For EF <40%, Aldosterone Inhibitor Prescribed? No: N/A Was EF assessed during THIS hospitalization? Yes Was Cardiac Rehab II ordered? (Included Medically managed Patients): Yes   Allergies No Known Allergies   Outstanding Labs/Studies   None  Duration of Discharge Encounter   Greater than 30 minutes including physician time.  Signed, Ellsworth LennoxBrittany M Strader, PA-C 02/02/2016, 4:40 PM

## 2016-02-02 NOTE — Progress Notes (Signed)
Echocardiogram 2D Echocardiogram has been performed.  Leslie Roberts N Leslie Roberts 02/02/2016, 9:49 AM 

## 2016-02-03 ENCOUNTER — Telehealth: Payer: Self-pay | Admitting: Physician Assistant

## 2016-02-03 NOTE — Telephone Encounter (Signed)
Returned call to patient.She stated she has several questions.Stated before she went to hospital she was not taking any medications.Stated now she is taking too much.Also she wants to go to [place] next week to see her father.Post hospital appointment moved up to Monday 02/07/16 at 11:00 am with Azalee Course PA at [facility] office.Advised to continue medications as prescribed.Bring a list of all medications to appointment.

## 2016-02-03 NOTE — Telephone Encounter (Signed)
New Message:     Pt just discharged from [facility] yesterday. Pt have questions about her medicine.

## 2016-02-07 ENCOUNTER — Ambulatory Visit (INDEPENDENT_AMBULATORY_CARE_PROVIDER_SITE_OTHER): Payer: BLUE CROSS/BLUE SHIELD | Admitting: Physician Assistant

## 2016-02-07 ENCOUNTER — Encounter: Payer: Self-pay | Admitting: Physician Assistant

## 2016-02-07 VITALS — BP 130/92 | HR 58 | Ht 63.0 in | Wt 162.4 lb

## 2016-02-07 DIAGNOSIS — I2542 Coronary artery dissection: Secondary | ICD-10-CM

## 2016-02-07 DIAGNOSIS — E785 Hyperlipidemia, unspecified: Secondary | ICD-10-CM

## 2016-02-07 NOTE — Patient Instructions (Addendum)
Medication Instructions:  Your physician recommends that you continue on your current medications as directed. Please refer to the Current Medication list given to you today.  Labwork: None ordered Today Your physician recommends that you return for lab work in: FASTING 2 MONTHS - LIPID, LFT  Testing/Procedures: None ordered  Follow-Up: Your physician recommends that you schedule a follow-up appointment in: 3 months with DR Anne Fu.  Any Other Special Instructions Will Be Listed Below (If Applicable).  Check blood pressure 2 hours after taking morning meds     If you need a refill on your cardiac medications before your next appointment, please call your pharmacy.

## 2016-02-07 NOTE — Progress Notes (Signed)
Cardiology Office Note    Date:  02/07/2016   ID:  Leslie Roberts, DOB 26-Sep-1960, MRN [id number]  PCP:  No PCP Per Patient  Cardiologist:  Dr. Anne Fu  Chief Complaint  Patient presents with  . Hospitalization Follow-up    seen for Dr. Anne Fu  2 week f/u--pt states no Sx.    History of Present Illness:  Leslie Roberts is a 55 y.o. female with PMH of diverticulosis, however no prior cardiac history who presented to [facility] on 01/31/2016 with chest pain. Troponin was initially mildly elevated at 0.23. EKG showed T-wave inversion in V1 through V3. Lipid panel showed mildly elevated LDL 102, cholesterol 169, HDL 48, triglyceride 95. Hemoglobin A1c 5.4. Cardiac catheterization was performed on 02/01/2016, showed EF 55-65%, 90% lateral D1 stenosis, otherwise clean coronaries. The distal tapering of the diagonal branch was suspicious for spontaneous coronary dissection. Medical therapy was recommended. Carotid renal artery ultrasound was obtained to rule out other vascular abnormality. Renal artery duplex showed no evidence of renal artery stenosis, bilateral normal intrarenal resistive indices. Carotid ultrasound also did not reveal significant carotid artery stenosis, there was elevated velocity noted in mid ICA bilaterally, however appears to be tortuosity. Echocardiogram obtained on 02/02/2016 showed EF 60-65%, grade 1 diastolic dysfunction, mild AR, mild TR.   During this admission, she was started on Lipitor 10 mg daily. Imdur 15 mg daily was added for hypertension and antianginal purposes, however it appears it has been stopped since. Aspirin and Plavix was also added as well along with metoprolol and lisinopril. He presents today for cardiology office visit. She denies any chest discomfort, shortness of breath, lower extremity edema, orthopnea or PND episodes since she left the hospital. She denies any significant bleeding issues. Otherwise she has been doing well. I plan to obtain  a repeat fasting lipid panel and LFTs in 2 month to reassess LDL. She wished to know how long she should continue on aspirin Plavix, given the fact that she had no stent placed during the recent admission, I think it is up to the primary cardiologist to decide on to the exact duration especially the reason she is on Plavix is because she was ruled in for NSTEMI. Her EKG was repeated today, the T-wave inversion in V1 through V3 is actually getting better. She asked me when she can go back to exercising in the gym, I told her to take it easy for the first 2 weeks after discharge, after which she can resume gym activity as tolerated.  She does not currently have a PCP, I recommended for her to establish with one.   Past Medical History:  Diagnosis Date  . CAD (coronary artery disease)    a. 01/2016: NSTEMI with 90% stenosis along D1 suspicious for SCAD. Otherwise, normal cors.   . Cat allergies   . Diverticulosis 04/2011  . HLD (hyperlipidemia)   . HTN (hypertension)   . Toenail fungus     Past Surgical History:  Procedure Laterality Date  . bilateral breast reduction  1982  . CARDIAC CATHETERIZATION N/A 02/01/2016   Procedure: Left Heart Cath and Coronary Angiography;  Surgeon: Yvonne Kendall, MD;  Location: [facility] INVASIVE CV LAB;  Service: Cardiovascular;  Laterality: N/A;  . COLONOSCOPY  2013  . ENDOMETRIAL BIOPSY  10/2009   benign    Current Medications: Outpatient Medications Prior to Visit  Medication Sig Dispense Refill  . aspirin EC 81 MG EC tablet Take 1 tablet (81 mg total) by mouth daily.    Marland Kitchen  atorvastatin (LIPITOR) 10 MG tablet Take 1 tablet (10 mg total) by mouth daily at 6 PM. 30 tablet 5  . B Complex Vitamins (VITAMIN B COMPLEX) CAPS Take by mouth daily.      . Calcium Carbonate-Vitamin D (CALCIUM + D PO) Take 500 mg by mouth daily.     . Cetirizine HCl (ZYRTEC PO) Take by mouth daily.    . clopidogrel (PLAVIX) 75 MG tablet Take 1 tablet (75 mg total) by mouth daily. 30  tablet 5  . fish oil-omega-3 fatty acids 1000 MG capsule Take 2 g by mouth daily.    Marland Kitchen lisinopril (PRINIVIL,ZESTRIL) 2.5 MG tablet Take 1 tablet (2.5 mg total) by mouth daily. 30 tablet 5  . metoprolol tartrate (LOPRESSOR) 25 MG tablet Take 0.5 tablets (12.5 mg total) by mouth 2 (two) times daily. 60 tablet 5  . Multiple Vitamin (MULTIVITAMIN) tablet Take 1 tablet by mouth daily.      . nitroGLYCERIN (NITROSTAT) 0.4 MG SL tablet Place 1 tablet (0.4 mg total) under the tongue every 5 (five) minutes x 3 doses as needed for chest pain. 25 tablet 3  . Polyethyl Glycol-Propyl Glycol (SYSTANE FREE OP) Place 1 drop into both eyes daily as needed. For dry eyes    . vitamin C (ASCORBIC ACID) 500 MG tablet Take 500 mg by mouth daily.      . ondansetron (ZOFRAN) 4 MG tablet Take 1 tablet (4 mg total) by mouth every 8 (eight) hours as needed for nausea or vomiting. 20 tablet 0   No facility-administered medications prior to visit.      Allergies:   Review of patient's allergies indicates no known allergies.   Social History   Social History  . Marital status: Married    Spouse name: N/A  . Number of children: N/A  . Years of education: N/A   Social History Main Topics  . Smoking status: Never Smoker  . Smokeless tobacco: Never Used  . Alcohol use 2.4 - 3.6 oz/week    4 - 6 Glasses of wine per week     Comment: 4-6 drinks a week (wine or beer)  . Drug use: No  . Sexual activity: Yes    Partners: Male    Birth control/ protection: Post-menopausal   Other Topics Concern  . None   Social History Narrative  . None     Family History:  The patient's family history includes Cancer in her mother; Diabetes in her father; Heart disease in her father; Hypertension in her father; Thyroid disease in her mother and sister.   ROS:   Please see the history of present illness.    ROS All other systems reviewed and are negative.   PHYSICAL EXAM:   VS:  BP (!) 130/92 (BP Location: Left Arm, Patient  Position: Sitting, Cuff Size: Normal)   Pulse (!) 58   Ht 5\' 3"  (1.6 m)   Wt 162 lb 6.4 oz (73.7 kg)   LMP 06/12/2009   BMI 28.77 kg/m    GEN: Well nourished, well developed, in no acute distress  HEENT: normal  Neck: no JVD, carotid bruits, or masses Cardiac: RRR; no murmurs, rubs, or gallops,no edema  Respiratory:  clear to auscultation bilaterally, normal work of breathing GI: soft, nontender, nondistended, + BS MS: no deformity or atrophy  Skin: warm and dry, no rash Neuro:  Alert and Oriented x 3, Strength and sensation are intact Psych: euthymic mood, full affect  Wt Readings from Last 3 Encounters:  02/07/16 162 lb 6.4 oz (73.7 kg)  02/02/16 162 lb 14.4 oz (73.9 kg)  05/24/15 174 lb 9.6 oz (79.2 kg)      Studies/Labs Reviewed:   EKG:  EKG is ordered today.  The ekg ordered today demonstrates NSR with TWI in lead V1-V2, however improved when compare to previous EKG  Recent Labs: 04/16/2015: ALT 16; TSH 3.549 02/01/2016: BUN 11; Creatinine, Ser 0.65; Potassium 3.8; Sodium 141 02/02/2016: Hemoglobin 12.4; Platelets 255   Lipid Panel    Component Value Date/Time   CHOL 169 02/01/2016 0402   TRIG 95 02/01/2016 0402   HDL 48 02/01/2016 0402   CHOLHDL 3.5 02/01/2016 0402   VLDL 19 02/01/2016 0402   LDLCALC 102 (H) 02/01/2016 0402    Additional studies/ records that were reviewed today include:    Cardiac cath 02/01/2016 Conclusion     The left ventricular systolic function is normal.  LV end diastolic pressure is normal.  The left ventricular ejection fraction is 55-65% by visual estimate.  Lat 1st Diag lesion, 90 %stenosed.   1. Angiographically normal RCA and left circumflex 2. Angiographically normal proximal LAD with distal tapering of the diagonal branch suspicious for spontaneous coronary artery dissection 3. Normal LV function with subtle apical hypokinesis  Medical therapy, echo, and carotid/renal artery duplex to evaluate for other vascular  anomalies such as FMD.      Renal US 02/02/2016  Summary:  - No evidence of renal artery stenosis noted bilaterally. - Bilateral normal intrarenal resistive indices.   Carotid US 02/02/2016 Summary:  - No significant extracranial carotid artery stenosis demonstrated.   Vertebrals are patent with antegrade flow. - Elevated velocities noted at mid ICA bilaterally, but appears to   be due to tortuosity. Other etiology could be FMD.    Echo 02/02/2016 LV EF: 60% -   65%  ------------------------------------------------------------------- Indications:      Chest pain 786.51.  ------------------------------------------------------------------- History:   PMH:  No prior cardiac history.  ------------------------------------------------------------------- Study Conclusions  - Left ventricle: The cavity size was normal. Systolic function was   normal. The estimated ejection fraction was in the range of 60%   to 65%. Wall motion was normal; there were no regional wall   motion abnormalities. Doppler parameters are consistent with   abnormal left ventricular relaxation (grade 1 diastolic   dysfunction). There was no evidence of elevated ventricular   filling pressure by Doppler parameters. - Aortic valve: There was mild regurgitation. - Aortic root: The aortic root was normal in size. - Ascending aorta: The ascending aorta was normal in size. - Mitral valve: Structurally normal valve. - Left atrium: The atrium was normal in size. - Right ventricle: The cavity size was normal. Wall thickness was   normal. Systolic function was normal. - Tricuspid valve: There was mild regurgitation. - Pulmonary arteries: Systolic pressure was within the normal   range. - Inferior vena cava: The vessel was normal in size. - Pericardium, extracardiac: There was no pericardial effusion.   ASSESSMENT:    1. Coronary artery dissection   2. Hyperlipidemia      PLAN:  In order of  problems listed above:  1. Coronary artery dissection: Underwent recent cardiac cath, essentially clean coronary except for 90% stenosis in D1 with distal tapering suspicious for coronary artery dissection. She has not had any symptoms since. EKG shows improving T wave inversion in V1 through V3. Echocardiogram showed normal ejection fraction. We'll continue on current medications for now. Blood pressure is mildly  elevated today at 130, however this is a single number, we'll not adjust medication based on this. She will check her daily blood pressure at home.  2. Hyperlipidemia: Started on 10 mg daily Lipitor in the hospital. We'll obtain fasting lipid panel and LFTs in 2 month.    Medication Adjustments/Labs and Tests Ordered: Current medicines are reviewed at length with the patient today.  Concerns regarding medicines are outlined above.  Medication changes, Labs and Tests ordered today are listed in the Patient Instructions below. Patient Instructions  Medication Instructions:  Your physician recommends that you continue on your current medications as directed. Please refer to the Current Medication list given to you today.  Labwork: None ordered Today Your physician recommends that you return for lab work in: FASTING 2 MONTHS - LIPID, LFT  Testing/Procedures: None ordered  Follow-Up: Your physician recommends that you schedule a follow-up appointment in: 3 months with DR Anne FuSKAINS.  Any Other Special Instructions Will Be Listed Below (If Applicable).  Check blood pressure 2 hours after taking morning meds     If you need a refill on your cardiac medications before your next appointment, please call your pharmacy.     Ramond DialSigned, Bethenny Losee, [place]  02/07/2016 12:07 PM    [facility] [address], [place], [place]  [address] Phone: [phone](336) [phone]; Fax: [phone]

## 2016-02-17 ENCOUNTER — Ambulatory Visit: Payer: BLUE CROSS/BLUE SHIELD | Admitting: Physician Assistant

## 2016-04-07 ENCOUNTER — Other Ambulatory Visit: Payer: Self-pay

## 2016-04-07 DIAGNOSIS — I2542 Coronary artery dissection: Secondary | ICD-10-CM

## 2016-04-07 DIAGNOSIS — E785 Hyperlipidemia, unspecified: Secondary | ICD-10-CM

## 2016-04-07 LAB — HEPATIC FUNCTION PANEL
ALK PHOS: 77 U/L (ref 33–130)
ALT: 17 U/L (ref 6–29)
AST: 19 U/L (ref 10–35)
Albumin: 4.1 g/dL (ref 3.6–5.1)
BILIRUBIN INDIRECT: 0.3 mg/dL (ref 0.2–1.2)
BILIRUBIN TOTAL: 0.4 mg/dL (ref 0.2–1.2)
Bilirubin, Direct: 0.1 mg/dL (ref <=0.2)
TOTAL PROTEIN: 7 g/dL (ref 6.1–8.1)

## 2016-04-07 LAB — LIPID PANEL
Cholesterol: 141 mg/dL (ref 125–200)
HDL: 51 mg/dL (ref >=46)
LDL Cholesterol: 67 mg/dL (ref <130)
TRIGLYCERIDES: 116 mg/dL (ref <150)
Total CHOL/HDL Ratio: 2.8 Ratio (ref <=5.0)
VLDL: 23 mg/dL (ref <30)

## 2016-05-09 ENCOUNTER — Ambulatory Visit (INDEPENDENT_AMBULATORY_CARE_PROVIDER_SITE_OTHER): Payer: BLUE CROSS/BLUE SHIELD | Admitting: Cardiology

## 2016-05-09 ENCOUNTER — Ambulatory Visit: Payer: BLUE CROSS/BLUE SHIELD | Admitting: Cardiology

## 2016-05-09 ENCOUNTER — Encounter: Payer: Self-pay | Admitting: Cardiology

## 2016-05-09 ENCOUNTER — Encounter (INDEPENDENT_AMBULATORY_CARE_PROVIDER_SITE_OTHER): Payer: Self-pay

## 2016-05-09 VITALS — BP 118/84 | HR 70 | Ht 63.0 in | Wt 161.6 lb

## 2016-05-09 DIAGNOSIS — I10 Essential (primary) hypertension: Secondary | ICD-10-CM

## 2016-05-09 DIAGNOSIS — I2542 Coronary artery dissection: Secondary | ICD-10-CM

## 2016-05-09 DIAGNOSIS — I214 Non-ST elevation (NSTEMI) myocardial infarction: Secondary | ICD-10-CM

## 2016-05-09 DIAGNOSIS — E7800 Pure hypercholesterolemia, unspecified: Secondary | ICD-10-CM

## 2016-05-09 NOTE — Progress Notes (Signed)
Cardiology Office Note    Date:  05/09/2016   ID:  Leslie Roberts, DOB 08-03-1960, MRN [id number]  PCP:  No PCP Per Patient  Cardiologist:   Donato Schultz, MD     History of Present Illness:  Leslie Roberts is a 55 y.o. female with NSTEMI 01/31/16 here for follow up.   Troponin was initially mildly elevated at 0.23. EKG showed T-wave inversion in V1 through V3. Lipid panel showed mildly elevated LDL 102, cholesterol 169, HDL 48, triglyceride 95. Hemoglobin A1c 5.4.   Cardiac catheterization was performed on 02/01/2016, showed EF 55-65%, 90% lateral D1 stenosis, otherwise normal coronaries. The distal tapering of the diagonal branch was suspicious for spontaneous coronary dissection. Medical therapy was recommended.   Carotid renal artery ultrasound was obtained to rule out other vascular abnormality. Renal artery duplex showed no evidence of renal artery stenosis, bilateral normal intrarenal resistive indices. Carotid ultrasound also did not reveal significant carotid artery stenosis, there was elevated velocity noted in mid ICA bilaterally, however appears to be tortuosity. Echocardiogram obtained on 02/02/2016 showed EF 60-65%, grade 1 diastolic dysfunction, mild AR, mild TR.   Overall she is doing quite well, no chest pain, no shortness of breath, no syncopal, no bleeding. She is discussed with several of her friends.   Past Medical History:  Diagnosis Date  . CAD (coronary artery disease)    a. 01/2016: NSTEMI with 90% stenosis along D1 suspicious for SCAD. Otherwise, normal cors.   . Cat allergies   . Diverticulosis 04/2011  . HLD (hyperlipidemia)   . HTN (hypertension)   . Toenail fungus     Past Surgical History:  Procedure Laterality Date  . bilateral breast reduction  1982  . CARDIAC CATHETERIZATION N/A 02/01/2016   Procedure: Left Heart Cath and Coronary Angiography;  Surgeon: Yvonne Kendall, MD;  Location: [facility] INVASIVE CV LAB;  Service: Cardiovascular;  Laterality: N/A;   . COLONOSCOPY  2013  . ENDOMETRIAL BIOPSY  10/2009   benign    Current Medications: Outpatient Medications Prior to Visit  Medication Sig Dispense Refill  . aspirin EC 81 MG EC tablet Take 1 tablet (81 mg total) by mouth daily.    Marland Kitchen atorvastatin (LIPITOR) 10 MG tablet Take 1 tablet (10 mg total) by mouth daily at 6 PM. 30 tablet 5  . B Complex Vitamins (VITAMIN B COMPLEX) CAPS Take by mouth daily.      . Cetirizine HCl (ZYRTEC PO) Take 1 tablet by mouth daily.     . fish oil-omega-3 fatty acids 1000 MG capsule Take 2 g by mouth daily.    Marland Kitchen lisinopril (PRINIVIL,ZESTRIL) 2.5 MG tablet Take 1 tablet (2.5 mg total) by mouth daily. 30 tablet 5  . metoprolol tartrate (LOPRESSOR) 25 MG tablet Take 0.5 tablets (12.5 mg total) by mouth 2 (two) times daily. 60 tablet 5  . Multiple Vitamin (MULTIVITAMIN) tablet Take 1 tablet by mouth daily.      . nitroGLYCERIN (NITROSTAT) 0.4 MG SL tablet Place 1 tablet (0.4 mg total) under the tongue every 5 (five) minutes x 3 doses as needed for chest pain. 25 tablet 3  . Polyethyl Glycol-Propyl Glycol (SYSTANE FREE OP) Place 1 drop into both eyes daily as needed. For dry eyes    . vitamin C (ASCORBIC ACID) 500 MG tablet Take 500 mg by mouth daily.      . Calcium Carbonate-Vitamin D (CALCIUM + D PO) Take 500 mg by mouth daily.     . clopidogrel (PLAVIX) 75 MG  tablet Take 1 tablet (75 mg total) by mouth daily. 30 tablet 5   No facility-administered medications prior to visit.      Allergies:   Patient has no known allergies.   Social History   Social History  . Marital status: Married    Spouse name: N/A  . Number of children: N/A  . Years of education: N/A   Social History Main Topics  . Smoking status: Never Smoker  . Smokeless tobacco: Never Used  . Alcohol use 2.4 - 3.6 oz/week    4 - 6 Glasses of wine per week     Comment: 4-6 drinks a week (wine or beer)  . Drug use: No  . Sexual activity: Yes    Partners: Male    Birth control/ protection:  Post-menopausal   Other Topics Concern  . None   Social History Narrative  . None     Family History:  The patient's family history includes Cancer in her mother; Diabetes in her father; Heart disease in her father; Hypertension in her father; Thyroid disease in her mother and sister.   ROS:   Please see the history of present illness.    ROS All other systems reviewed and are negative.   PHYSICAL EXAM:   VS:  BP 118/84   Pulse 70   Ht 5\' 3"  (1.6 m)   Wt 161 lb 9.6 oz (73.3 kg)   LMP 06/12/2009   BMI 28.63 kg/m    GEN: Well nourished, well developed, in no acute distress  HEENT: normal  Neck: no JVD, carotid bruits, or masses Cardiac: RRR; no murmurs, rubs, or gallops,no edema  Respiratory:  clear to auscultation bilaterally, normal work of breathing GI: soft, nontender, nondistended, + BS MS: no deformity or atrophy  Skin: warm and dry, no rash Neuro:  Alert and Oriented x 3, Strength and sensation are intact Psych: euthymic mood, full affect  Wt Readings from Last 3 Encounters:  05/09/16 161 lb 9.6 oz (73.3 kg)  02/07/16 162 lb 6.4 oz (73.7 kg)  02/02/16 162 lb 14.4 oz (73.9 kg)      Studies/Labs Reviewed:   EKG:  None today  Recent Labs: 02/01/2016: BUN 11; Creatinine, Ser 0.65; Potassium 3.8; Sodium 141 02/02/2016: Hemoglobin 12.4; Platelets 255 04/07/2016: ALT 17   Lipid Panel    Component Value Date/Time   CHOL 141 04/07/2016 0837   TRIG 116 04/07/2016 0837   HDL 51 04/07/2016 0837   CHOLHDL 2.8 04/07/2016 0837   VLDL 23 04/07/2016 0837   LDLCALC 67 04/07/2016 0837    Additional studies/ records that were reviewed today include:   Cardiac cath 02/01/2016 Conclusion     The left ventricular systolic function is normal.  LV end diastolic pressure is normal.  The left ventricular ejection fraction is 55-65% by visual estimate.  Lat 1st Diag lesion, 90 %stenosed.  1. Angiographically normal RCA and left circumflex 2. Angiographically  normal proximal LAD with distal tapering of the diagonal branch suspicious for spontaneous coronary artery dissection 3. Normal LV function with subtle apical hypokinesis  Medical therapy, echo, and carotid/renal artery duplex to evaluate for other vascular anomalies such as FMD.     Renal US 02/02/2016 Summary:  - No evidence of renal artery stenosis noted bilaterally. - Bilateral normal intrarenal resistive indices.   Carotid US 02/02/2016 Summary:  - No significant extracranial carotid artery stenosis demonstrated. Vertebrals are patent with antegrade flow. - Elevated velocities noted at mid ICA bilaterally, but appears to  be due to tortuosity. Other etiology could be FMD.   Echo 02/02/2016 LV EF: 60% - 65%  ------------------------------------------------------------------- Indications: Chest pain 786.51.  ------------------------------------------------------------------- History: PMH: No prior cardiac history.  ------------------------------------------------------------------- Study Conclusions  - Left ventricle: The cavity size was normal. Systolic function was normal. The estimated ejection fraction was in the range of 60% to 65%. Wall motion was normal; there were no regional wall motion abnormalities. Doppler parameters are consistent with abnormal left ventricular relaxation (grade 1 diastolic dysfunction). There was no evidence of elevated ventricular filling pressure by Doppler parameters. - Aortic valve: There was mild regurgitation. - Aortic root: The aortic root was normal in size. - Ascending aorta: The ascending aorta was normal in size. - Mitral valve: Structurally normal valve. - Left atrium: The atrium was normal in size. - Right ventricle: The cavity size was normal. Wall thickness was normal. Systolic function was normal. - Tricuspid valve: There was mild regurgitation. - Pulmonary arteries: Systolic  pressure was within the normal range. - Inferior vena cava: The vessel was normal in size. - Pericardium, extracardiac: There was no pericardial effusion.  ASSESSMENT:    1. NSTEMI (non-ST elevated myocardial infarction) (HCC)   2. Coronary artery dissection   3. Pure hypercholesterolemia   4. Essential hypertension      PLAN:  In order of problems listed above:  Non-ST elevation myocardial infarction-spontaneous coronary artery dissection of diagonal branch  - Low level troponin 0.2  - Normal ejection fraction 60%.  - No further symptoms. Doing well.  - We will stop Plavix  - Continue aspirin 81 mg.  - Continue statin, low-dose beta blocker, low-dose ACE inhibitor  Essential hypertension  - Blood pressure diastolic 84. We would like less than 80.  - Continue with low-dose beta blocker and low-dose ACE inhibitor.  - OK to monitor at home periodically.  - She was interested in trying to come off of some medications. I would like to continue both the ACE inhibitor and the metoprolol given her diastolic pressure  Hyperlipidemia  - LDL 67 now on Lipitor 10 mg. Continue.  - Liver functions normal.     Medication Adjustments/Labs and Tests Ordered: Current medicines are reviewed at length with the patient today.  Concerns regarding medicines are outlined above.  Medication changes, Labs and Tests ordered today are listed in the Patient Instructions below. Patient Instructions  Medication Instructions:  Please discontinue your Plavix. Continue all other medications as listed.   Follow-Up: Follow up in 6 months with Dr. Anne FuSkains.  You will receive a letter in the mail 2 months before you are due.  Please call us when you receive this letter to schedule your follow up appointment.  If you need a refill on your cardiac medications before your next appointment, please call your pharmacy.  Thank you for choosing [facility]!!        Signed, Donato SchultzMark Annel Zunker, MD    05/09/2016 9:59 AM    [facility] [address], [place], [place]  [address] Phone: [phone](336) [phone]; Fax: [phone](336) [phone]

## 2016-05-09 NOTE — Patient Instructions (Addendum)
Medication Instructions:  Please discontinue your Plavix. Continue all other medications as listed.   Follow-Up: Follow up in 6 months with Dr. Anne Fu.  You will receive a letter in the mail 2 months before you are due.  Please call us when you receive this letter to schedule your follow up appointment.  If you need a refill on your cardiac medications before your next appointment, please call your pharmacy.  Thank you for choosing [facility]!!

## 2016-05-10 ENCOUNTER — Ambulatory Visit (INDEPENDENT_AMBULATORY_CARE_PROVIDER_SITE_OTHER): Payer: BLUE CROSS/BLUE SHIELD | Admitting: Obstetrics and Gynecology

## 2016-05-10 ENCOUNTER — Encounter: Payer: Self-pay | Admitting: Obstetrics and Gynecology

## 2016-05-10 VITALS — BP 104/66 | HR 76 | Resp 16 | Ht 62.5 in | Wt 160.6 lb

## 2016-05-10 DIAGNOSIS — Z0000 Encounter for general adult medical examination without abnormal findings: Secondary | ICD-10-CM | POA: Diagnosis not present

## 2016-05-10 DIAGNOSIS — Z01419 Encounter for gynecological examination (general) (routine) without abnormal findings: Secondary | ICD-10-CM

## 2016-05-10 LAB — POCT URINALYSIS DIPSTICK
BILIRUBIN UA: NEGATIVE
Glucose, UA: NEGATIVE
Ketones, UA: NEGATIVE
LEUKOCYTES UA: NEGATIVE
NITRITE UA: NEGATIVE
PH UA: 5
PROTEIN UA: NEGATIVE
RBC UA: NEGATIVE
UROBILINOGEN UA: NEGATIVE

## 2016-05-10 NOTE — Progress Notes (Signed)
55 y.o. G1P0020 Married Caucasian female here for annual exam.    Had MI in August 2017.  Presented with recurrent angina.  Off Plavix.   Still with vaginal dryness.  Vit E used a few times.  Has not tried cooking.  KY jelly did not work.   No vaginal bleeding.   PCP:   None  Patient's last menstrual period was 06/12/2009.           Sexually active: Yes.   female The current method of family planning is post menopausal status.    Exercising: Yes.    Walks 30 minutes 3-5 days/week Smoker:  no  Health Maintenance: Pap:  01-17-13 Neg:Neg HR HPV History of abnormal Pap:  no MMG:  07-20-15 Density A/Neg/BiRads1:[facility] Colonoscopy:  05-12-11 diverticulosis/nl with Dr. Erick Blinks.  Next 04/2021.  BMD:   n/a  Result  n/a TDaP:  02-09-14 Gardasil:   N/A,  Labs: Hospital 01/2016 Urine today: Neg   reports that she has never smoked. She has never used smokeless tobacco. She reports that she drinks about 1.8 - 3.0 oz of alcohol per week . She reports that she does not use drugs.  Past Medical History:  Diagnosis Date  . CAD (coronary artery disease)    a. 01/2016: NSTEMI with 90% stenosis along D1 suspicious for SCAD. Otherwise, normal cors.   . Cat allergies   . Diverticulosis 04/2011  . Heart attack 01/31/2016  . HLD (hyperlipidemia)   . HTN (hypertension)   . Toenail fungus     Past Surgical History:  Procedure Laterality Date  . bilateral breast reduction  1982  . CARDIAC CATHETERIZATION N/A 02/01/2016   Procedure: Left Heart Cath and Coronary Angiography;  Surgeon: Yvonne Kendall, MD;  Location: [facility] INVASIVE CV LAB;  Service: Cardiovascular;  Laterality: N/A;  . COLONOSCOPY  2013  . ENDOMETRIAL BIOPSY  10/2009   benign    Current Outpatient Prescriptions  Medication Sig Dispense Refill  . aspirin EC 81 MG EC tablet Take 1 tablet (81 mg total) by mouth daily.    Marland Kitchen atorvastatin (LIPITOR) 10 MG tablet Take 1 tablet (10 mg total) by mouth daily at 6 PM. 30 tablet 5   . B Complex Vitamins (VITAMIN B COMPLEX) CAPS Take by mouth daily.      . Cetirizine HCl (ZYRTEC PO) Take 1 tablet by mouth daily.     . fish oil-omega-3 fatty acids 1000 MG capsule Take 2 g by mouth daily.    Marland Kitchen lisinopril (PRINIVIL,ZESTRIL) 2.5 MG tablet Take 1 tablet (2.5 mg total) by mouth daily. 30 tablet 5  . metoprolol tartrate (LOPRESSOR) 25 MG tablet Take 0.5 tablets (12.5 mg total) by mouth 2 (two) times daily. 60 tablet 5  . Multiple Vitamin (MULTIVITAMIN) tablet Take 1 tablet by mouth daily.      . nitroGLYCERIN (NITROSTAT) 0.4 MG SL tablet Place 1 tablet (0.4 mg total) under the tongue every 5 (five) minutes x 3 doses as needed for chest pain. 25 tablet 3  . Polyethyl Glycol-Propyl Glycol (SYSTANE FREE OP) Place 1 drop into both eyes daily as needed. For dry eyes    . vitamin C (ASCORBIC ACID) 500 MG tablet Take 500 mg by mouth daily.       No current facility-administered medications for this visit.     Family History  Problem Relation Age of Onset  . Hypertension Father   . Heart disease Father   . Diabetes Father   . Cancer Mother   .  Thyroid disease Mother     hyperthyroid  . Thyroid disease Sister     hypothyroid  . Colon cancer Neg Hx 79    Dec cancer unknown origin  . Esophageal cancer Neg Hx   . Stomach cancer Neg Hx     ROS:  Pertinent items are noted in HPI.  Otherwise, a comprehensive ROS was negative.  Exam:   BP 104/66 (BP Location: Right Arm, Patient Position: Sitting, Cuff Size: Normal)   Pulse 76   Resp 16   Ht 5' 2.5" (1.588 m)   Wt 160 lb 9.6 oz (72.8 kg)   LMP 06/12/2009   BMI 28.91 kg/m     General appearance: alert, cooperative and appears stated age Head: Normocephalic, without obvious abnormality, atraumatic Neck: no adenopathy, supple, symmetrical, trachea midline and thyroid normal to inspection and palpation Lungs: clear to auscultation bilaterally Breasts: normal appearance, no masses or tenderness, No nipple retraction or dimpling,  No nipple discharge or bleeding, No axillary or supraclavicular adenopathy Heart: regular rate and rhythm Abdomen: soft, non-tender; no masses, no organomegaly Extremities: extremities normal, atraumatic, no cyanosis or edema Skin: Skin color, texture, turgor normal. No rashes or lesions Lymph nodes: Cervical, supraclavicular, and axillary nodes normal. No abnormal inguinal nodes palpated Neurologic: Grossly normal  Pelvic: External genitalia:  no lesions              Urethra:  normal appearing urethra with no masses, tenderness or lesions              Bartholins and Skenes: normal                 Vagina: normal appearing vagina with normal color and discharge, no lesions.  Atrophy noted.              Cervix: no lesions              Pap taken: Yes.   Bimanual Exam:  Uterus:  normal size, contour, position, consistency, mobility, non-tender              Adnexa: no mass, fullness, tenderness              Rectal exam: Yes.  .  Confirms.              Anus:  normal sphincter tone, no lesions  Chaperone was present for exam.  Assessment:   Well woman visit with normal exam. Vaginal atrophy.  Recent MI.  Plan: Yearly mammogram recommended after age 55.  Recommended self breast exam.  Pap and HR HPV as above. Discussed Calcium, Vitamin D, regular exercise program including cardiovascular and weight bearing exercise. Discussed cooking oils for lubrication.  If wants to use vaginal estrogen, will need to get approval from cardiology due to recent MI. Follow up annually and prn.      After visit summary provided.

## 2016-05-10 NOTE — Patient Instructions (Signed)

## 2016-05-15 LAB — IPS PAP TEST WITH HPV

## 2016-06-19 ENCOUNTER — Telehealth (HOSPITAL_COMMUNITY): Payer: Self-pay | Admitting: Cardiac Rehabilitation

## 2016-06-19 NOTE — Telephone Encounter (Signed)
Phone call to discuss enrolling in cardiac rehab.  Pt declined due to work schedule.  Pt is exercising on her own, walking at lunchtime.

## 2016-07-28 ENCOUNTER — Other Ambulatory Visit: Payer: Self-pay | Admitting: Student

## 2016-08-29 ENCOUNTER — Other Ambulatory Visit: Payer: Self-pay | Admitting: Obstetrics and Gynecology

## 2016-08-29 DIAGNOSIS — Z1231 Encounter for screening mammogram for malignant neoplasm of breast: Secondary | ICD-10-CM

## 2016-11-08 ENCOUNTER — Encounter: Payer: Self-pay | Admitting: Cardiology

## 2016-11-08 ENCOUNTER — Ambulatory Visit
Admission: RE | Admit: 2016-11-08 | Discharge: 2016-11-08 | Disposition: A | Discharge status: Home / Self Care | Payer: 59 | Source: Ambulatory Visit | Attending: Obstetrics and Gynecology | Admitting: Obstetrics and Gynecology

## 2016-11-08 ENCOUNTER — Encounter (INDEPENDENT_AMBULATORY_CARE_PROVIDER_SITE_OTHER): Payer: Self-pay

## 2016-11-08 ENCOUNTER — Ambulatory Visit (INDEPENDENT_AMBULATORY_CARE_PROVIDER_SITE_OTHER): Payer: 59 | Admitting: Cardiology

## 2016-11-08 VITALS — BP 120/88 | HR 56 | Ht 63.0 in | Wt 158.0 lb

## 2016-11-08 DIAGNOSIS — I10 Essential (primary) hypertension: Secondary | ICD-10-CM

## 2016-11-08 DIAGNOSIS — I2542 Coronary artery dissection: Secondary | ICD-10-CM | POA: Diagnosis not present

## 2016-11-08 DIAGNOSIS — Z1231 Encounter for screening mammogram for malignant neoplasm of breast: Secondary | ICD-10-CM

## 2016-11-08 DIAGNOSIS — I252 Old myocardial infarction: Secondary | ICD-10-CM | POA: Diagnosis not present

## 2016-11-08 DIAGNOSIS — E7800 Pure hypercholesterolemia, unspecified: Secondary | ICD-10-CM | POA: Diagnosis not present

## 2016-11-08 MED ORDER — ATORVASTATIN CALCIUM 10 MG PO TABS: 10.0000 mg | ORAL_TABLET | Freq: Every day | ORAL | 3 refills | Status: DC

## 2016-11-08 MED ORDER — NITROGLYCERIN 0.4 MG SL SUBL: 0.4000 mg | SUBLINGUAL_TABLET | SUBLINGUAL | 3 refills | Status: DC | PRN

## 2016-11-08 MED ORDER — METOPROLOL TARTRATE 25 MG PO TABS: 12.5000 mg | ORAL_TABLET | Freq: Two times a day (BID) | ORAL | 3 refills | Status: DC

## 2016-11-08 NOTE — Progress Notes (Signed)
Cardiology Office Note    Date:  11/08/2016   ID:  Leslie Roberts, DOB 1960-08-06, MRN [id number]  PCP:  Patient, No Pcp Per  Cardiologist:   Donato Schultz, MD     History of Present Illness:  Leslie Roberts is a 56 y.o. female with NSTEMI 01/31/16 Secondary to spontaneous coronary artery dissection here for follow up.   Troponin was initially mildly elevated at 0.23. EKG showed T-wave inversion in V1 through V3. Lipid panel showed mildly elevated LDL 102, cholesterol 169, HDL 48, triglyceride 95. Hemoglobin A1c 5.4.   Cardiac catheterization was performed on 02/01/2016, showed EF 55-65%, 90% lateral D1 stenosis, otherwise normal coronaries. The distal tapering of the diagonal branch was suspicious for spontaneous coronary dissection. Medical therapy was recommended.   Carotid renal artery ultrasound was obtained to rule out other vascular abnormality. Renal artery duplex showed no evidence of renal artery stenosis, bilateral normal intrarenal resistive indices. Carotid ultrasound also did not reveal significant carotid artery stenosis, there was elevated velocity noted in mid ICA bilaterally, however appears to be tortuosity. Echocardiogram obtained on 02/02/2016 showed EF 60-65%, grade 1 diastolic dysfunction, mild AR, mild TR.   Doing very well, no chest pain, no shortness of breath, no syncopal. Enjoying Zumba. We discussed ketogenic diet.   Past Medical History:  Diagnosis Date  . CAD (coronary artery disease)    a. 01/2016: NSTEMI with 90% stenosis along D1 suspicious for SCAD. Otherwise, normal cors.   . Cat allergies   . Diverticulosis 04/2011  . Heart attack (HCC) 01/31/2016  . HLD (hyperlipidemia)   . HTN (hypertension)   . Toenail fungus     Past Surgical History:  Procedure Laterality Date  . bilateral breast reduction  1982  . CARDIAC CATHETERIZATION N/A 02/01/2016   Procedure: Left Heart Cath and Coronary Angiography;  Surgeon: Yvonne Kendall, MD;  Location: [facility]  INVASIVE CV LAB;  Service: Cardiovascular;  Laterality: N/A;  . COLONOSCOPY  2013  . ENDOMETRIAL BIOPSY  10/2009   benign    Current Medications: Outpatient Medications Prior to Visit  Medication Sig Dispense Refill  . aspirin EC 81 MG EC tablet Take 1 tablet (81 mg total) by mouth daily.    . B Complex Vitamins (VITAMIN B COMPLEX) CAPS Take by mouth daily.      . Cetirizine HCl (ZYRTEC PO) Take 1 tablet by mouth daily.     . fish oil-omega-3 fatty acids 1000 MG capsule Take 2 g by mouth daily.    . Multiple Vitamin (MULTIVITAMIN) tablet Take 1 tablet by mouth daily.      Bertram Gala Glycol-Propyl Glycol (SYSTANE FREE OP) Place 1 drop into both eyes daily as needed. For dry eyes    . vitamin C (ASCORBIC ACID) 500 MG tablet Take 500 mg by mouth daily.      Marland Kitchen atorvastatin (LIPITOR) 10 MG tablet Take 1 tablet (10 mg total) by mouth daily at 6 PM. 30 tablet 5  . lisinopril (PRINIVIL,ZESTRIL) 2.5 MG tablet Take 1 tablet (2.5 mg total) by mouth daily. 30 tablet 5  . metoprolol tartrate (LOPRESSOR) 25 MG tablet Take 0.5 tablets (12.5 mg total) by mouth 2 (two) times daily. 60 tablet 5  . nitroGLYCERIN (NITROSTAT) 0.4 MG SL tablet Place 1 tablet (0.4 mg total) under the tongue every 5 (five) minutes x 3 doses as needed for chest pain. 25 tablet 3   No facility-administered medications prior to visit.      Allergies:   Patient has no  known allergies.   Social History   Social History  . Marital status: Married    Spouse name: N/A  . Number of children: N/A  . Years of education: N/A   Social History Main Topics  . Smoking status: Never Smoker  . Smokeless tobacco: Never Used  . Alcohol use 1.8 - 3.0 oz/week    3 - 5 Glasses of wine per week     Comment: 4-6 drinks a week (wine or beer)  . Drug use: No  . Sexual activity: Yes    Partners: Male    Birth control/ protection: Post-menopausal   Other Topics Concern  . None   Social History Narrative  . None     Family History:   The patient's family history includes Cancer in her mother; Diabetes in her father; Heart disease in her father; Hypertension in her father; Thyroid disease in her mother and sister.   ROS:   Please see the history of present illness.    Review of Systems  All other systems reviewed and are negative.     PHYSICAL EXAM:   VS:  BP 120/88   Pulse (!) 56   Ht 5\' 3"  (1.6 m)   Wt 158 lb (71.7 kg)   LMP 06/12/2009   BMI 27.99 kg/m    GEN: Well nourished, well developed, in no acute distress  HEENT: normal  Neck: no JVD, carotid bruits, or masses Cardiac: RRR; no murmurs, rubs, or gallops,no edema  Respiratory:  clear to auscultation bilaterally, normal work of breathing GI: soft, nontender, nondistended, + BS MS: no deformity or atrophy  Skin: warm and dry, no rash Neuro:  Alert and Oriented x 3, Strength and sensation are intact Psych: euthymic mood, full affect  Wt Readings from Last 3 Encounters:  11/08/16 158 lb (71.7 kg)  05/10/16 160 lb 9.6 oz (72.8 kg)  05/09/16 161 lb 9.6 oz (73.3 kg)      Studies/Labs Reviewed:   EKG:  11/08/16-sinus bradycardia first-degree AV block and complete right bundle branch block heart rate 56 bpm PR interval 226 ms. Personally viewed   Recent Labs: 02/01/2016: BUN 11; Creatinine, Ser 0.65; Potassium 3.8; Sodium 141 02/02/2016: Hemoglobin 12.4; Platelets 255 04/07/2016: ALT 17   Lipid Panel    Component Value Date/Time   CHOL 141 04/07/2016 0837   TRIG 116 04/07/2016 0837   HDL 51 04/07/2016 0837   CHOLHDL 2.8 04/07/2016 0837   VLDL 23 04/07/2016 0837   LDLCALC 67 04/07/2016 0837    Additional studies/ records that were reviewed today include:   Cardiac cath 02/01/2016 Conclusion     The left ventricular systolic function is normal.  LV end diastolic pressure is normal.  The left ventricular ejection fraction is 55-65% by visual estimate.  Lat 1st Diag lesion, 90 %stenosed.  1. Angiographically normal RCA and left  circumflex 2. Angiographically normal proximal LAD with distal tapering of the diagonal branch suspicious for spontaneous coronary artery dissection 3. Normal LV function with subtle apical hypokinesis  Medical therapy, echo, and carotid/renal artery duplex to evaluate for other vascular anomalies such as FMD.     Renal US 02/02/2016 Summary:  - No evidence of renal artery stenosis noted bilaterally. - Bilateral normal intrarenal resistive indices.   Carotid US 02/02/2016 Summary:  - No significant extracranial carotid artery stenosis demonstrated. Vertebrals are patent with antegrade flow. - Elevated velocities noted at mid ICA bilaterally, but appears to be due to tortuosity. Other etiology could be FMD.  Echo 02/02/2016 LV EF: 60% - 65%  ------------------------------------------------------------------- Indications: Chest pain 786.51.  ------------------------------------------------------------------- History: PMH: No prior cardiac history.  ------------------------------------------------------------------- Study Conclusions  - Left ventricle: The cavity size was normal. Systolic function was normal. The estimated ejection fraction was in the range of 60% to 65%. Wall motion was normal; there were no regional wall motion abnormalities. Doppler parameters are consistent with abnormal left ventricular relaxation (grade 1 diastolic dysfunction). There was no evidence of elevated ventricular filling pressure by Doppler parameters. - Aortic valve: There was mild regurgitation. - Aortic root: The aortic root was normal in size. - Ascending aorta: The ascending aorta was normal in size. - Mitral valve: Structurally normal valve. - Left atrium: The atrium was normal in size. - Right ventricle: The cavity size was normal. Wall thickness was normal. Systolic function was normal. - Tricuspid valve: There was mild regurgitation. -  Pulmonary arteries: Systolic pressure was within the normal range. - Inferior vena cava: The vessel was normal in size. - Pericardium, extracardiac: There was no pericardial effusion.  ASSESSMENT:    1. Old MI (myocardial infarction)   2. Benign essential HTN   3. Pure hypercholesterolemia   4. Coronary artery dissection      PLAN:  In order of problems listed above:  Non-ST elevation myocardial infarction-spontaneous coronary artery dissection of diagonal branch  - Low level troponin 0.2  - Normal ejection fraction 60%.  - No further symptoms. Doing well.  - Continue aspirin 81 mg.  - Continue statin, low-dose beta blocker - will consider stopping low-dose statin at next year's appointment. She does have a first-degree AV block.  - We will stop low-dose lisinopril  Essential hypertension  - Currently well controlled  - Continue with low-dose beta blocker  Stopping low-dose ACE inhibitor.  - OK to monitor at home periodically.   Hyperlipidemia  - LDL 67 now on Lipitor 10 mg. Continue.  - Liver functions normal. no changes. No myalgias. She is not interested in any high-intensity statin dose.     Medication Adjustments/Labs and Tests Ordered: Current medicines are reviewed at length with the patient today.  Concerns regarding medicines are outlined above.  Medication changes, Labs and Tests ordered today are listed in the Patient Instructions below. Patient Instructions  Medication Instructions:  1) STOP Lisinopril  Labwork: None  Testing/Procedures: None  Follow-Up: Your physician wants you to follow-up in: 1 year with Dr. Anne Fu.  You will receive a reminder letter in the mail two months in advance. If you don't receive a letter, please call our office to schedule the follow-up appointment.   Any Other Special Instructions Will Be Listed Below (If Applicable).     If you need a refill on your cardiac medications before your next appointment, please call  your pharmacy.      Signed, Donato Schultz, MD  11/08/2016 9:28 AM    [facility] [address] [place], [place], [place]  [address] Phone: [phone]; Fax: [phone]

## 2016-11-08 NOTE — Patient Instructions (Signed)
Medication Instructions:  1) STOP Lisinopril  Labwork: None  Testing/Procedures: None  Follow-Up: Your physician wants you to follow-up in: 1 year with Dr. Anne Fu.  You will receive a reminder letter in the mail two months in advance. If you don't receive a letter, please call our office to schedule the follow-up appointment.   Any Other Special Instructions Will Be Listed Below (If Applicable).     If you need a refill on your cardiac medications before your next appointment, please call your pharmacy.

## 2017-05-11 ENCOUNTER — Ambulatory Visit: Payer: BLUE CROSS/BLUE SHIELD | Admitting: Obstetrics and Gynecology

## 2017-06-14 ENCOUNTER — Telehealth: Payer: Self-pay | Admitting: Obstetrics and Gynecology

## 2017-06-14 NOTE — Telephone Encounter (Signed)
Patient states she had sex last week with a new partner and "something just does not feel right down there".  Patient states she is itchy and has some bumps on the right thigh that "look like they could pop at any moment."

## 2017-06-14 NOTE — Telephone Encounter (Signed)
Spoke with patient and complaining of vaginal itching and states she has some "bumps" on right thigh/labia that are painful. She does have a new partner and requesting office visit. Made appointment with Dr.Miller 06-15-17 2:30pm.

## 2017-06-15 ENCOUNTER — Encounter: Payer: Self-pay | Admitting: Obstetrics & Gynecology

## 2017-06-15 ENCOUNTER — Other Ambulatory Visit: Payer: Self-pay

## 2017-06-15 ENCOUNTER — Ambulatory Visit (INDEPENDENT_AMBULATORY_CARE_PROVIDER_SITE_OTHER): Payer: 59 | Admitting: Obstetrics & Gynecology

## 2017-06-15 VITALS — BP 120/80 | HR 80 | Resp 16 | Ht 63.0 in | Wt 159.0 lb

## 2017-06-15 DIAGNOSIS — N766 Ulceration of vulva: Secondary | ICD-10-CM

## 2017-06-15 MED ORDER — VALACYCLOVIR HCL 1 G PO TABS: 1000.0000 mg | ORAL_TABLET | Freq: Two times a day (BID) | ORAL | 0 refills | Status: DC

## 2017-06-15 MED ORDER — VALACYCLOVIR HCL 500 MG PO TABS: 500.0000 mg | ORAL_TABLET | Freq: Every day | ORAL | 5 refills | Status: DC

## 2017-06-15 MED ORDER — LIDOCAINE 5 % EX OINT: 1.0000 "application " | TOPICAL_OINTMENT | Freq: Four times a day (QID) | CUTANEOUS | 0 refills | Status: DC | PRN

## 2017-06-15 NOTE — Progress Notes (Signed)
GYNECOLOGY  VISIT  CC:   Vaginal itching  HPI: 57 y.o. G23P0020 Caucasian female here for vaginal itching.  Newly dating new person and had intercourse this past weekend.  Reports significant other had recent fever blister.  They did have oral sex as well.  Relationship is good.  After the weekend, she began to have some internal itching which she felt may be due to recent intercourse and menopause.  She sed OTC Monistat.  Itching is better.  Denies vaginal bleeding but did have some watery vaginal discharge with the itching.  Also has noted an area on her inner thigh that looks a little like a rash.  Denies pain or fatigue.  Denies fever.  GYNECOLOGIC HISTORY: Patient's last menstrual period was 06/12/2009. Contraception: post menopausal Menopausal hormone therapy: none  Patient Active Problem List   Diagnosis Date Noted  . HLD (hyperlipidemia) 02/02/2016  . Benign essential HTN 02/02/2016  . Nausea 02/02/2016  . Non-STEMI (non-ST elevated myocardial infarction) (HCC) 01/31/2016    Past Medical History:  Diagnosis Date  . CAD (coronary artery disease)    a. 01/2016: NSTEMI with 90% stenosis along D1 suspicious for SCAD. Otherwise, normal cors.   . Cat allergies   . Diverticulosis 04/2011  . Heart attack (HCC) 01/31/2016  . HLD (hyperlipidemia)   . HTN (hypertension)   . Toenail fungus     Past Surgical History:  Procedure Laterality Date  . bilateral breast reduction  1982  . CARDIAC CATHETERIZATION N/A 02/01/2016   Procedure: Left Heart Cath and Coronary Angiography;  Surgeon: Yvonne Kendall, MD;  Location: [facility] INVASIVE CV LAB;  Service: Cardiovascular;  Laterality: N/A;  . COLONOSCOPY  2013  . ENDOMETRIAL BIOPSY  10/2009   benign  . REDUCTION MAMMAPLASTY Bilateral 1986    MEDS:   Current Outpatient Medications on File Prior to Visit  Medication Sig Dispense Refill  . aspirin EC 81 MG EC tablet Take 1 tablet (81 mg total) by mouth daily.    Marland Kitchen atorvastatin (LIPITOR)  10 MG tablet Take 1 tablet (10 mg total) by mouth daily at 6 PM. 90 tablet 3  . B Complex Vitamins (VITAMIN B COMPLEX) CAPS Take by mouth daily.      . Cetirizine HCl (ZYRTEC PO) Take 1 tablet by mouth daily.     . fish oil-omega-3 fatty acids 1000 MG capsule Take 2 g by mouth daily.    . metoprolol tartrate (LOPRESSOR) 25 MG tablet Take 0.5 tablets (12.5 mg total) by mouth 2 (two) times daily. 45 tablet 3  . Multiple Vitamin (MULTIVITAMIN) tablet Take 1 tablet by mouth daily.      Bertram Gala Glycol-Propyl Glycol (SYSTANE FREE OP) Place 1 drop into both eyes daily as needed. For dry eyes    . vitamin C (ASCORBIC ACID) 500 MG tablet Take 500 mg by mouth daily.      . nitroGLYCERIN (NITROSTAT) 0.4 MG SL tablet Place 1 tablet (0.4 mg total) under the tongue every 5 (five) minutes x 3 doses as needed for chest pain. (Patient not taking: Reported on 06/15/2017) 25 tablet 3   No current facility-administered medications on file prior to visit.     ALLERGIES: Patient has no known allergies.  Family History  Problem Relation Age of Onset  . Hypertension Father   . Heart disease Father   . Diabetes Father   . Cancer Mother   . Thyroid disease Mother        hyperthyroid  . Thyroid disease Sister  hypothyroid  . Colon cancer Neg Hx 79       Dec cancer unknown origin  . Esophageal cancer Neg Hx   . Stomach cancer Neg Hx   . Breast cancer Neg Hx     SH:  Non smoker  Review of Systems  Genitourinary:       Vaginal itching Bumps   All other systems reviewed and are negative.   PHYSICAL EXAMINATION:    BP 120/80 (BP Location: Right Arm, Patient Position: Sitting, Cuff Size: Normal)   Pulse 80   Resp 16   Ht 5\' 3"  (1.6 m)   Wt 159 lb (72.1 kg)   LMP 06/12/2009   BMI 28.17 kg/m     Physical Exam  Constitutional: She appears well-developed and well-nourished.  GI: Soft. Bowel sounds are normal. She exhibits no distension.  Genitourinary: Vagina normal and uterus normal.     There is tenderness and lesion on the right labia. There is no rash or injury on the right labia. There is tenderness and lesion on the left labia. There is no rash or injury on the left labia. Cervix exhibits no motion tenderness and no discharge. Right adnexum displays no mass, no tenderness and no fullness. Left adnexum displays no mass, no tenderness and no fullness.   Chaperone was present for exam.  Assessment: New onset of vesicular appearing lesions on right inner thigh and inner labia majora c/w HSV  Plan: HSV PCR obtained.  Results will be called to pt and follow up recommendations will be made at that point. Valtrex 1gm BID x 10 days Lidocaine 5% ointment topically every 4-6 hours as needed for pain control.

## 2017-06-18 ENCOUNTER — Telehealth: Payer: Self-pay

## 2017-06-18 LAB — HERPES SIMPLEX VIRUS(HSV) DNA BY PCR
HSV 2 DNA: NEGATIVE
HSV-1 DNA: POSITIVE — AB

## 2017-06-18 NOTE — Telephone Encounter (Signed)
Spoke with patient and notified of positive HSV 1. Patient voiced understanding and is taking medication. Made follow up appointment for 06-26-17 4:15pm.

## 2017-06-18 NOTE — Telephone Encounter (Signed)
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  Would like to recheck with her in about 10 days.  Thanks.

## 2017-06-18 NOTE — Telephone Encounter (Signed)
Called patient to review results and left message on voicemail to return my call.

## 2017-06-26 ENCOUNTER — Telehealth: Payer: Self-pay | Admitting: Cardiology

## 2017-06-26 ENCOUNTER — Other Ambulatory Visit: Payer: Self-pay

## 2017-06-26 ENCOUNTER — Ambulatory Visit (INDEPENDENT_AMBULATORY_CARE_PROVIDER_SITE_OTHER): Payer: 59 | Admitting: Obstetrics & Gynecology

## 2017-06-26 ENCOUNTER — Encounter: Payer: Self-pay | Admitting: Obstetrics & Gynecology

## 2017-06-26 VITALS — BP 120/80 | HR 78 | Resp 12 | Wt 158.0 lb

## 2017-06-26 DIAGNOSIS — A609 Anogenital herpesviral infection, unspecified: Secondary | ICD-10-CM | POA: Diagnosis not present

## 2017-06-26 NOTE — Telephone Encounter (Signed)
New Message       *STAT* If patient is at the pharmacy, call can be transferred to refill team.   1. Which medications need to be refilled? (please list name of each medication and dose if known)  metoprolol tartrate (LOPRESSOR) 25 MG tablet Take 0.5 tablets (12.5 mg total) by mouth 2 (two) times daily.     2. Which pharmacy/location (including street and city if local pharmacy) is medication to be sent to? [facility] on [address]   3. Do they need a 30 day or 90 day supply?  90

## 2017-06-26 NOTE — Progress Notes (Signed)
GYNECOLOGY  VISIT  CC:   Follow up after HSV 1 diagnosis  HPI: 57 y.o. G69P0020 Divorced Caucasian female here for follow up after having primary HSV outbreak.  PCR testing showed HSV 1.  New sexual partner has active fever blister when having oral sex before outbreak began.  Reports she is almost done with the valtrex.  Feeling much better.  Skin irritation is improved.  Not concerned about skin appearance as is much better.  D/w partner and so glad she did.  Has decided to start suppressive therapy for 3-6 months to prevent another outbreak.  Reports, at times, she had headaches related to Valtrex dosing.  Will be glad when completed.  Does not want to try a different medication yet.  Will take this at night.  D/W pt additional STD testing.  Declines today.  GYNECOLOGIC HISTORY: Patient's last menstrual period was 06/12/2009. Contraception: PMP Menopausal hormone therapy: none  Patient Active Problem List   Diagnosis Date Noted  . HLD (hyperlipidemia) 02/02/2016  . Benign essential HTN 02/02/2016  . Nausea 02/02/2016  . Non-STEMI (non-ST elevated myocardial infarction) (HCC) 01/31/2016    Past Medical History:  Diagnosis Date  . CAD (coronary artery disease)    a. 01/2016: NSTEMI with 90% stenosis along D1 suspicious for SCAD. Otherwise, normal cors.   . Cat allergies   . Diverticulosis 04/2011  . Heart attack (HCC) 01/31/2016  . HLD (hyperlipidemia)   . HTN (hypertension)   . Toenail fungus     Past Surgical History:  Procedure Laterality Date  . bilateral breast reduction  1982  . CARDIAC CATHETERIZATION N/A 02/01/2016   Procedure: Left Heart Cath and Coronary Angiography;  Surgeon: Yvonne Kendall, MD;  Location: [facility] INVASIVE CV LAB;  Service: Cardiovascular;  Laterality: N/A;  . COLONOSCOPY  2013  . ENDOMETRIAL BIOPSY  10/2009   benign  . REDUCTION MAMMAPLASTY Bilateral 1986    MEDS:   Current Outpatient Medications on File Prior to Visit  Medication Sig Dispense  Refill  . aspirin EC 81 MG EC tablet Take 1 tablet (81 mg total) by mouth daily.    Marland Kitchen atorvastatin (LIPITOR) 10 MG tablet Take 1 tablet (10 mg total) by mouth daily at 6 PM. 90 tablet 3  . B Complex Vitamins (VITAMIN B COMPLEX) CAPS Take by mouth daily.      . Cetirizine HCl (ZYRTEC PO) Take 1 tablet by mouth daily.     . fish oil-omega-3 fatty acids 1000 MG capsule Take 2 g by mouth daily.    Marland Kitchen lidocaine (XYLOCAINE) 5 % ointment Apply 1 application topically 4 (four) times daily as needed. 1.25 g 0  . metoprolol tartrate (LOPRESSOR) 25 MG tablet Take 0.5 tablets (12.5 mg total) by mouth 2 (two) times daily. 45 tablet 3  . Multiple Vitamin (MULTIVITAMIN) tablet Take 1 tablet by mouth daily.      Bertram Gala Glycol-Propyl Glycol (SYSTANE FREE OP) Place 1 drop into both eyes daily as needed. For dry eyes    . valACYclovir (VALTREX) 500 MG tablet Take 1 tablet (500 mg total) by mouth daily. 30 tablet 5  . vitamin C (ASCORBIC ACID) 500 MG tablet Take 500 mg by mouth daily.      . nitroGLYCERIN (NITROSTAT) 0.4 MG SL tablet Place 1 tablet (0.4 mg total) under the tongue every 5 (five) minutes x 3 doses as needed for chest pain. (Patient not taking: Reported on 06/15/2017) 25 tablet 3   No current facility-administered medications on file prior  to visit.     ALLERGIES: Patient has no known allergies.  Family History  Problem Relation Age of Onset  . Hypertension Father   . Heart disease Father   . Diabetes Father   . Cancer Mother   . Thyroid disease Mother        hyperthyroid  . Thyroid disease Sister        hypothyroid  . Colon cancer Neg Hx 79       Dec cancer unknown origin  . Esophageal cancer Neg Hx   . Stomach cancer Neg Hx   . Breast cancer Neg Hx     SH:  Divorced, non smoker  Review of Systems  All other systems reviewed and are negative.   PHYSICAL EXAMINATION:    BP 120/80 (BP Location: Right Arm, Patient Position: Sitting, Cuff Size: Normal)   Pulse 78   Resp 12    Wt 158 lb (71.7 kg)   LMP 06/12/2009   BMI 27.99 kg/m     General appearance: alert, cooperative and appears stated age Abdomen: soft, non-tender; bowel sounds normal; no masses,  no organomegaly  Pelvic: External genitalia:  no lesions, probable scarring on right inner thih              Urethra:  normal appearing urethra with no masses, tenderness or lesions              Bartholins and Skenes: normal                 Vagina: normal appearing vagina with normal color and discharge, no lesions               Anus:  normal sphincter tone, no lesions  Chaperone was present for exam.  Assessment: Primary genital HSV outbreak with PCR showing HSV 1  Plan: Starting on daily Valtrex 500mg  daily as in new relationship and wants to decrease risks.  Declines additional STD testing   ~15 minutes spent with patient >50% of time was in face to face discussion of above.

## 2017-06-29 ENCOUNTER — Other Ambulatory Visit: Payer: Self-pay | Admitting: Cardiology

## 2017-07-02 ENCOUNTER — Encounter: Payer: Self-pay | Admitting: Obstetrics and Gynecology

## 2017-07-02 ENCOUNTER — Other Ambulatory Visit: Payer: Self-pay

## 2017-07-02 ENCOUNTER — Ambulatory Visit (INDEPENDENT_AMBULATORY_CARE_PROVIDER_SITE_OTHER): Payer: 59 | Admitting: Obstetrics and Gynecology

## 2017-07-02 ENCOUNTER — Telehealth: Payer: Self-pay | Admitting: Obstetrics & Gynecology

## 2017-07-02 VITALS — BP 122/80 | HR 64 | Temp 98.2°F | Resp 16 | Wt 156.0 lb

## 2017-07-02 DIAGNOSIS — R3915 Urgency of urination: Secondary | ICD-10-CM | POA: Diagnosis not present

## 2017-07-02 DIAGNOSIS — N3090 Cystitis, unspecified without hematuria: Secondary | ICD-10-CM | POA: Diagnosis not present

## 2017-07-02 DIAGNOSIS — R300 Dysuria: Secondary | ICD-10-CM | POA: Diagnosis not present

## 2017-07-02 DIAGNOSIS — R350 Frequency of micturition: Secondary | ICD-10-CM

## 2017-07-02 LAB — POCT URINALYSIS DIPSTICK
BILIRUBIN UA: NEGATIVE
Glucose, UA: NEGATIVE
KETONES UA: NEGATIVE
PH UA: 7 (ref 5.0–8.0)
UROBILINOGEN UA: NEGATIVE U/dL — AB

## 2017-07-02 MED ORDER — SULFAMETHOXAZOLE-TRIMETHOPRIM 800-160 MG PO TABS: 1.0000 | ORAL_TABLET | Freq: Two times a day (BID) | ORAL | 0 refills | Status: DC

## 2017-07-02 MED ORDER — PHENAZOPYRIDINE HCL 200 MG PO TABS: 200.0000 mg | ORAL_TABLET | Freq: Three times a day (TID) | ORAL | 0 refills | Status: DC | PRN

## 2017-07-02 NOTE — Telephone Encounter (Signed)
Spoke with patient. Reports urinary frequency, urgency and possible blood in urine, symptoms started 07/01/17.   Urine is dark today, denies blood. Feels tired.   Denies N/V, fever/chills, lower back pain.   Recommended OV for further evaluation, advised patient will need to review schedule with nursing supervisor and return call, patient is agreeable.

## 2017-07-02 NOTE — Telephone Encounter (Signed)
Reviewed schedule with nursing supervisor. Call returned to patient. OV scheduled for today at 3:15pm with Dr. Hyacinth Meeker. Patient is aware she will be worked into schedule, verbalizes understanding and is agreeable.  Routing to provider for final review. Patient is agreeable to disposition. Will close encounter.

## 2017-07-02 NOTE — Progress Notes (Signed)
GYNECOLOGY  VISIT   HPI: 57 y.o.  Separated Caucasian  female   G2P0020 with Patient's last menstrual period was 06/12/2009.   here c/o urinary urgency and frequency X 2 days. She c/o supra pubic discomfort if her bladder starts to fill. Voiding small amounts. No real burning with urination. No fever, no back pain. She noticed slight hematuria last night. None today. No vaginal bleeding.    She is sexually active, more so in the last month, new partner. Some entry dyspareunia, helped with lubricant.   GYNECOLOGIC HISTORY: Patient's last menstrual period was 06/12/2009. Contraception:postmenopause  Menopausal hormone therapy: none         OB History    Gravida Para Term Preterm AB Living   2 0     2 0   SAB TAB Ectopic Multiple Live Births     2               Patient Active Problem List   Diagnosis Date Noted  . HLD (hyperlipidemia) 02/02/2016  . Benign essential HTN 02/02/2016  . Nausea 02/02/2016  . Non-STEMI (non-ST elevated myocardial infarction) (HCC) 01/31/2016    Past Medical History:  Diagnosis Date  . CAD (coronary artery disease)    a. 01/2016: NSTEMI with 90% stenosis along D1 suspicious for SCAD. Otherwise, normal cors.   . Cat allergies   . Diverticulosis 04/2011  . Heart attack (HCC) 01/31/2016  . HLD (hyperlipidemia)   . HTN (hypertension)   . Toenail fungus     Past Surgical History:  Procedure Laterality Date  . bilateral breast reduction  1982  . CARDIAC CATHETERIZATION N/A 02/01/2016   Procedure: Left Heart Cath and Coronary Angiography;  Surgeon: Yvonne Kendall, MD;  Location: [facility] INVASIVE CV LAB;  Service: Cardiovascular;  Laterality: N/A;  . COLONOSCOPY  2013  . ENDOMETRIAL BIOPSY  10/2009   benign  . REDUCTION MAMMAPLASTY Bilateral 1986    Current Outpatient Medications  Medication Sig Dispense Refill  . aspirin EC 81 MG EC tablet Take 1 tablet (81 mg total) by mouth daily.    Marland Kitchen atorvastatin (LIPITOR) 10 MG tablet Take 1 tablet (10 mg total)  by mouth daily at 6 PM. 90 tablet 3  . B Complex Vitamins (VITAMIN B COMPLEX) CAPS Take by mouth daily.      . Cetirizine HCl (ZYRTEC PO) Take 1 tablet by mouth daily.     . fish oil-omega-3 fatty acids 1000 MG capsule Take 2 g by mouth daily.    Marland Kitchen lidocaine (XYLOCAINE) 5 % ointment Apply 1 application topically 4 (four) times daily as needed. 1.25 g 0  . metoprolol tartrate (LOPRESSOR) 25 MG tablet TAKE 1/2 TABLET BY MOUTH 2 TIMES A DAY 90 tablet 1  . Multiple Vitamin (MULTIVITAMIN) tablet Take 1 tablet by mouth daily.      . nitroGLYCERIN (NITROSTAT) 0.4 MG SL tablet Place 1 tablet (0.4 mg total) under the tongue every 5 (five) minutes x 3 doses as needed for chest pain. (Patient not taking: Reported on 06/15/2017) 25 tablet 3  . Polyethyl Glycol-Propyl Glycol (SYSTANE FREE OP) Place 1 drop into both eyes daily as needed. For dry eyes    . valACYclovir (VALTREX) 500 MG tablet Take 1 tablet (500 mg total) by mouth daily. 30 tablet 5  . vitamin C (ASCORBIC ACID) 500 MG tablet Take 500 mg by mouth daily.       No current facility-administered medications for this visit.      ALLERGIES: Patient has  no known allergies.  Family History  Problem Relation Age of Onset  . Hypertension Father   . Heart disease Father   . Diabetes Father   . Cancer Mother   . Thyroid disease Mother        hyperthyroid  . Thyroid disease Sister        hypothyroid  . Colon cancer Neg Hx 79       Dec cancer unknown origin  . Esophageal cancer Neg Hx   . Stomach cancer Neg Hx   . Breast cancer Neg Hx     Social History   Socioeconomic History  . Marital status: Married    Spouse name: Not on file  . Number of children: Not on file  . Years of education: Not on file  . Highest education level: Not on file  Social Needs  . Financial resource strain: Not on file  . Food insecurity - worry: Not on file  . Food insecurity - inability: Not on file  . Transportation needs - medical: Not on file  .  Transportation needs - non-medical: Not on file  Occupational History  . Not on file  Tobacco Use  . Smoking status: Never Smoker  . Smokeless tobacco: Never Used  Substance and Sexual Activity  . Alcohol use: Yes    Alcohol/week: 1.8 - 3.0 oz    Types: 3 - 5 Glasses of wine per week    Comment: 4-6 drinks a week (wine or beer)  . Drug use: No  . Sexual activity: Yes    Partners: Male    Birth control/protection: Post-menopausal  Other Topics Concern  . Not on file  Social History Narrative  . Not on file    Review of Systems  Constitutional: Negative.   HENT: Negative.   Eyes: Negative.   Respiratory: Negative.   Cardiovascular: Negative.   Gastrointestinal: Negative.   Genitourinary: Positive for dysuria, frequency and urgency.  Skin: Negative.   Neurological: Negative.   Endo/Heme/Allergies: Negative.   Psychiatric/Behavioral: Negative.     PHYSICAL EXAMINATION:    BP 122/80 (BP Location: Right Arm, Patient Position: Sitting, Cuff Size: Normal)   Pulse 64   Resp 16   Wt 156 lb (70.8 kg)   LMP 06/12/2009   BMI 27.63 kg/m     General appearance: alert, cooperative and appears stated age Abdomen: soft, mild suprapubic tenderness non distended, no masses,  no organomegaly   Urine dip: 2+blood, 3+ leuk, + nitrate  ASSESSMENT Cystitis Dyspareunia, entry, helped with lubricant    PLAN Bactrim DS Pyridium Send urine for ua, c&s   An After Visit Summary was printed and given to the patient.

## 2017-07-02 NOTE — Patient Instructions (Signed)

## 2017-07-02 NOTE — Telephone Encounter (Signed)
Patient is having uti symptoms and would like to come in today.

## 2017-07-03 LAB — URINALYSIS, MICROSCOPIC ONLY
BACTERIA UA: NONE SEEN
Casts: NONE SEEN /lpf
RBC, UA: >30 /hpf — AB (ref 0–?)
WBC, UA: >30 /hpf — AB (ref 0–?)

## 2017-07-05 LAB — URINE CULTURE

## 2017-07-06 ENCOUNTER — Telehealth: Payer: Self-pay

## 2017-07-06 NOTE — Telephone Encounter (Signed)
Spoke with patient. Results given as seen below. Patient is agreeable. Stats she is feeling well and is no longer having symptoms.  Notes recorded by Romualdo Bolk, MD on 07/05/2017 at 5:33 PM EST Sensitive to the antibiotic she was treated with, please inform ------  Notes recorded by Lorri Frederick, LPN on 1/50/5697 at 10:05 AM EST Spoke with patient and gave results for urine culture. Patient is feeling much better. -eh ------  Notes recorded by Romualdo Bolk, MD on 07/04/2017 at 9:39 AM EST Please inform the patient that her culture was + for infection. Sensitivities for antibiotics is still pending. See if she is feeling better with the bactrim  Encounter closed.

## 2017-08-07 ENCOUNTER — Ambulatory Visit (INDEPENDENT_AMBULATORY_CARE_PROVIDER_SITE_OTHER): Payer: 59 | Admitting: Obstetrics and Gynecology

## 2017-08-07 ENCOUNTER — Encounter: Payer: Self-pay | Admitting: Obstetrics and Gynecology

## 2017-08-07 ENCOUNTER — Other Ambulatory Visit: Payer: Self-pay

## 2017-08-07 VITALS — BP 128/88 | HR 70 | Resp 14 | Ht 62.75 in | Wt 157.0 lb

## 2017-08-07 DIAGNOSIS — Z113 Encounter for screening for infections with a predominantly sexual mode of transmission: Secondary | ICD-10-CM

## 2017-08-07 DIAGNOSIS — Z01419 Encounter for gynecological examination (general) (routine) without abnormal findings: Secondary | ICD-10-CM | POA: Diagnosis not present

## 2017-08-07 NOTE — Progress Notes (Signed)
57 y.o. G70P0020 Married Caucasian female here for annual exam.    Recent dx HSV I and E Coli UTI.  New partner.  Taking Valtrex daily.  STD screening desired.   PCP:   No PCP per patient  Cardiology:  Dr. Caro Hight.  Patient's last menstrual period was 06/12/2009.           Sexually active: Yes.    The current method of family planning is post menopausal status.    Exercising: Yes.    walking and gym exercises Smoker:  no  Health Maintenance: Pap:  05/11/16 negative, HR HPV negative, 01/17/13 negative, HR HPV negative  History of abnormal Pap:  no MMG:  11/08/16 density B/negative/BIRADS 1: [facility] Colonoscopy:  05-12-11 diverticulosis/nl with Dr. Erick Blinks. Next 04/2021 BMD:   n/a  Result  n/a TDaP:  02/09/14  Gardasil:   n/a Screening Labs:  Hb today: discuss with provider, Urine today: not collected    reports that  has never smoked. she has never used smokeless tobacco. She reports that she drinks about 1.8 - 3.0 oz of alcohol per week. She reports that she does not use drugs.  Past Medical History:  Diagnosis Date  . CAD (coronary artery disease)    a. 01/2016: NSTEMI with 90% stenosis along D1 suspicious for SCAD. Otherwise, normal cors.   . Cat allergies   . Diverticulosis 04/2011  . Heart attack (HCC) 01/31/2016  . HLD (hyperlipidemia)   . HSV-1 infection   . HTN (hypertension)   . Toenail fungus     Past Surgical History:  Procedure Laterality Date  . bilateral breast reduction  1982  . CARDIAC CATHETERIZATION N/A 02/01/2016   Procedure: Left Heart Cath and Coronary Angiography;  Surgeon: Yvonne Kendall, MD;  Location: [facility] INVASIVE CV LAB;  Service: Cardiovascular;  Laterality: N/A;  . COLONOSCOPY  2013  . ENDOMETRIAL BIOPSY  10/2009   benign  . REDUCTION MAMMAPLASTY Bilateral 1986    Current Outpatient Medications  Medication Sig Dispense Refill  . aspirin EC 81 MG EC tablet Take 1 tablet (81 mg total) by mouth daily.    Marland Kitchen atorvastatin (LIPITOR)  10 MG tablet Take 1 tablet (10 mg total) by mouth daily at 6 PM. 90 tablet 3  . B Complex Vitamins (VITAMIN B COMPLEX) CAPS Take by mouth daily.      . Cetirizine HCl (ZYRTEC PO) Take 1 tablet by mouth daily.     . fish oil-omega-3 fatty acids 1000 MG capsule Take 2 g by mouth daily.    Marland Kitchen lidocaine (XYLOCAINE) 5 % ointment Apply 1 application topically 4 (four) times daily as needed. 1.25 g 0  . metoprolol tartrate (LOPRESSOR) 25 MG tablet TAKE 1/2 TABLET BY MOUTH 2 TIMES A DAY 90 tablet 1  . Multiple Vitamin (MULTIVITAMIN) tablet Take 1 tablet by mouth daily.      . nitroGLYCERIN (NITROSTAT) 0.4 MG SL tablet Place 1 tablet (0.4 mg total) under the tongue every 5 (five) minutes x 3 doses as needed for chest pain. 25 tablet 3  . Polyethyl Glycol-Propyl Glycol (SYSTANE FREE OP) Place 1 drop into both eyes daily as needed. For dry eyes    . valACYclovir (VALTREX) 500 MG tablet Take 1 tablet (500 mg total) by mouth daily. 30 tablet 5  . vitamin C (ASCORBIC ACID) 500 MG tablet Take 500 mg by mouth daily.       No current facility-administered medications for this visit.     Family History  Problem Relation Age of Onset  . Hypertension Father   . Heart disease Father   . Diabetes Father   . Cancer Mother        unknown primary  . Thyroid disease Mother        hyperthyroid  . Thyroid disease Sister        hypothyroid  . Colon cancer Neg Hx 79       Dec cancer unknown origin  . Esophageal cancer Neg Hx   . Stomach cancer Neg Hx   . Breast cancer Neg Hx     ROS:  Pertinent items are noted in HPI.  Otherwise, a comprehensive ROS was negative.  Exam:   BP 128/88 (BP Location: Right Arm, Patient Position: Sitting, Cuff Size: Normal)   Pulse 70   Resp 14   Ht 5' 2.75" (1.594 m)   Wt 157 lb (71.2 kg)   LMP 06/12/2009   BMI 28.03 kg/m     General appearance: alert, cooperative and appears stated age Head: Normocephalic, without obvious abnormality, atraumatic Neck: no adenopathy,  supple, symmetrical, trachea midline and thyroid normal to inspection and palpation Lungs: clear to auscultation bilaterally Breasts: normal appearance, no masses or tenderness, No nipple retraction or dimpling, No nipple discharge or bleeding, No axillary or supraclavicular adenopathy Heart: regular rate and rhythm Abdomen: soft, non-tender; no masses, no organomegaly Extremities: extremities normal, atraumatic, no cyanosis or edema Skin: Skin color, texture, turgor normal. No rashes or lesions Lymph nodes: Cervical, supraclavicular, and axillary nodes normal. No abnormal inguinal nodes palpated Neurologic: Grossly normal  Pelvic: External genitalia:  no lesions              Urethra:  normal appearing urethra with no masses, tenderness or lesions              Bartholins and Skenes: normal                 Vagina: normal appearing vagina with normal color and discharge, no lesions              Cervix: no lesions              Pap taken: No. Bimanual Exam:  Uterus:  normal size, contour, position, consistency, mobility, non-tender              Adnexa: no mass, fullness, tenderness              Rectal exam: Yes.  .  Confirms.              Anus:  normal sphincter tone, no lesions  Chaperone was present for exam.  Assessment:   Well woman visit with normal exam. Vaginal atrophy.  HSV I.  Recent UTI.  Hx MI. STD screening.   Plan: Mammogram screening discussed. Recommended self breast awareness. Pap and HR HPV as above. Guidelines for Calcium, Vitamin D, regular exercise program including cardiovascular and weight bearing exercise. STD screening.  Discussed HSV.  Condoms recommended. Follow up annually and prn.   After visit summary provided.

## 2017-08-07 NOTE — Patient Instructions (Signed)

## 2017-08-08 LAB — HEP, RPR, HIV PANEL
HIV SCREEN 4TH GENERATION: NONREACTIVE
Hepatitis B Surface Ag: NEGATIVE
RPR Ser Ql: NONREACTIVE

## 2017-08-08 LAB — HEPATITIS C ANTIBODY

## 2017-08-13 LAB — CHLAMYDIA/GONOCOCCUS/TRICHOMONAS, NAA
Chlamydia by NAA: NEGATIVE
GONOCOCCUS BY NAA: NEGATIVE

## 2017-08-14 ENCOUNTER — Telehealth: Payer: Self-pay | Admitting: *Deleted

## 2017-08-14 NOTE — Telephone Encounter (Signed)
Notes recorded by Leda Min, RN on 08/14/2017 at 12:02 PM EST Left message to call Noreene Larsson at [phone].

## 2017-08-14 NOTE — Telephone Encounter (Signed)
-----   Message from Patton Salles, MD sent at 08/13/2017  8:17 PM EST ----- Please report results to patient. Her testing for infectious disease is all negative for HIV, syphilis, hepatitis B and C, gonorrhea, and chlamydia.  The lab was unable to test for trichomonas due to an interfering substance.  If she would like testing for this, she will need to return.

## 2017-08-20 NOTE — Telephone Encounter (Signed)
Spoke with patient, advised of results as seen below per Dr. Edward Jolly. Patient declines OV for testing for trichomonas at this time. Patient verbalizes understanding.   Routing to provider for final review. Patient is agreeable to disposition. Will close encounter.

## 2017-10-22 ENCOUNTER — Encounter: Payer: Self-pay | Admitting: Cardiology

## 2017-11-12 ENCOUNTER — Ambulatory Visit (INDEPENDENT_AMBULATORY_CARE_PROVIDER_SITE_OTHER): Payer: 59 | Admitting: Cardiology

## 2017-11-12 ENCOUNTER — Encounter: Payer: Self-pay | Admitting: Cardiology

## 2017-11-12 ENCOUNTER — Encounter (INDEPENDENT_AMBULATORY_CARE_PROVIDER_SITE_OTHER): Payer: Self-pay

## 2017-11-12 VITALS — BP 116/84 | HR 56 | Ht 62.75 in | Wt 157.1 lb

## 2017-11-12 DIAGNOSIS — I2542 Coronary artery dissection: Secondary | ICD-10-CM | POA: Diagnosis not present

## 2017-11-12 DIAGNOSIS — I10 Essential (primary) hypertension: Secondary | ICD-10-CM | POA: Diagnosis not present

## 2017-11-12 DIAGNOSIS — E7800 Pure hypercholesterolemia, unspecified: Secondary | ICD-10-CM | POA: Diagnosis not present

## 2017-11-12 MED ORDER — NITROGLYCERIN 0.4 MG SL SUBL: 0.4000 mg | SUBLINGUAL_TABLET | SUBLINGUAL | 3 refills | Status: DC | PRN

## 2017-11-12 NOTE — Progress Notes (Signed)
Cardiology Office Note    Date:  11/12/2017   ID:  Leslie Roberts, DOB 05-17-1961, MRN [id number]  PCP:  Patient, No Pcp Per  Cardiologist:   Donato Schultz, MD     History of Present Illness:  Leslie Roberts is a 57 y.o. female with NSTEMI 01/31/16 Secondary to spontaneous coronary artery dissection here for follow up.   Troponin was initially mildly elevated at 0.23. EKG showed T-wave inversion in V1 through V3. Lipid panel showed mildly elevated LDL 102, cholesterol 169, HDL 48, triglyceride 95. Hemoglobin A1c 5.4.   Cardiac catheterization was performed on 02/01/2016, showed EF 55-65%, 90% lateral D1 stenosis, otherwise normal coronaries. The distal tapering of the diagonal branch was suspicious for spontaneous coronary dissection. Medical therapy was recommended.   Carotid renal artery ultrasound was obtained to rule out other vascular abnormality. Renal artery duplex showed no evidence of renal artery stenosis, bilateral normal intrarenal resistive indices. Carotid ultrasound also did not reveal significant carotid artery stenosis, there was elevated velocity noted in mid ICA bilaterally, however appears to be tortuosity. Echocardiogram obtained on 02/02/2016 showed EF 60-65%, grade 1 diastolic dysfunction, mild AR, mild TR.   Doing very well, no chest pain, no shortness of breath, no syncopal. Enjoying Zumba. We discussed ketogenic diet.  11/12/2017-overall doing quite well, no fevers chills nausea vomiting syncope bruising chest pain. Walking.  Going on a trip to [place], visiting 6 different parks.  Busy trip.  Overall doing quite well.  No chest pain.  We decided to stop her metoprolol.   Past Medical History:  Diagnosis Date  . CAD (coronary artery disease)    a. 01/2016: NSTEMI with 90% stenosis along D1 suspicious for SCAD. Otherwise, normal cors.   . Cat allergies   . Diverticulosis 04/2011  . Heart attack (HCC) 01/31/2016  . HLD (hyperlipidemia)   . HSV-1 infection   . HTN  (hypertension)   . Toenail fungus     Past Surgical History:  Procedure Laterality Date  . bilateral breast reduction  1982  . CARDIAC CATHETERIZATION N/A 02/01/2016   Procedure: Left Heart Cath and Coronary Angiography;  Surgeon: Yvonne Kendall, MD;  Location: [facility] INVASIVE CV LAB;  Service: Cardiovascular;  Laterality: N/A;  . COLONOSCOPY  2013  . ENDOMETRIAL BIOPSY  10/2009   benign  . REDUCTION MAMMAPLASTY Bilateral 1986    Current Medications: Outpatient Medications Prior to Visit  Medication Sig Dispense Refill  . aspirin EC 81 MG EC tablet Take 1 tablet (81 mg total) by mouth daily.    Marland Kitchen atorvastatin (LIPITOR) 10 MG tablet Take 1 tablet (10 mg total) by mouth daily at 6 PM. 90 tablet 3  . B Complex Vitamins (VITAMIN B COMPLEX) CAPS Take by mouth daily.      . Cetirizine HCl (ZYRTEC PO) Take 1 tablet by mouth daily.     . fish oil-omega-3 fatty acids 1000 MG capsule Take 2 g by mouth daily.    . Multiple Vitamin (MULTIVITAMIN) tablet Take 1 tablet by mouth daily.      Bertram Gala Glycol-Propyl Glycol (SYSTANE FREE OP) Place 1 drop into both eyes daily as needed. For dry eyes    . valACYclovir (VALTREX) 500 MG tablet Take 1 tablet (500 mg total) by mouth daily. 30 tablet 5  . vitamin C (ASCORBIC ACID) 500 MG tablet Take 500 mg by mouth daily.      . metoprolol tartrate (LOPRESSOR) 25 MG tablet TAKE 1/2 TABLET BY MOUTH 2 TIMES A DAY 90  tablet 1  . nitroGLYCERIN (NITROSTAT) 0.4 MG SL tablet Place 1 tablet (0.4 mg total) under the tongue every 5 (five) minutes x 3 doses as needed for chest pain. 25 tablet 3  . lidocaine (XYLOCAINE) 5 % ointment Apply 1 application topically 4 (four) times daily as needed. (Patient not taking: Reported on 11/12/2017) 1.25 g 0   No facility-administered medications prior to visit.      Allergies:   Patient has no known allergies.   Social History   Socioeconomic History  . Marital status: Married    Spouse name: Not on file  . Number of children:  Not on file  . Years of education: Not on file  . Highest education level: Not on file  Occupational History  . Not on file  Social Needs  . Financial resource strain: Not on file  . Food insecurity:    Worry: Not on file    Inability: Not on file  . Transportation needs:    Medical: Not on file    Non-medical: Not on file  Tobacco Use  . Smoking status: Never Smoker  . Smokeless tobacco: Never Used  Substance and Sexual Activity  . Alcohol use: Yes    Alcohol/week: 1.8 - 3.0 oz    Types: 3 - 5 Glasses of wine per week    Comment: 4-6 drinks a week (wine or beer)  . Drug use: No  . Sexual activity: Yes    Partners: Male    Birth control/protection: Post-menopausal  Lifestyle  . Physical activity:    Days per week: Not on file    Minutes per session: Not on file  . Stress: Not on file  Relationships  . Social connections:    Talks on phone: Not on file    Gets together: Not on file    Attends religious service: Not on file    Active member of club or organization: Not on file    Attends meetings of clubs or organizations: Not on file    Relationship status: Not on file  Other Topics Concern  . Not on file  Social History Narrative  . Not on file     Family History:  The patient's family history includes Cancer in her mother; Diabetes in her father; Heart disease in her father; Hypertension in her father; Thyroid disease in her mother and sister.   ROS:   Please see the history of present illness.    Review of Systems  All other systems reviewed and are negative.     PHYSICAL EXAM:   VS:  BP 116/84   Pulse (!) 56   Ht 5' 2.75" (1.594 m)   Wt 157 lb 1.9 oz (71.3 kg)   LMP 06/12/2009   BMI 28.05 kg/m    GEN: Well nourished, well developed, in no acute distress  HEENT: normal  Neck: no JVD, carotid bruits, or masses Cardiac: RRR; no murmurs, rubs, or gallops,no edema  Respiratory:  clear to auscultation bilaterally, normal work of breathing GI: soft,  nontender, nondistended, + BS MS: no deformity or atrophy  Skin: warm and dry, no rash Neuro:  Alert and Oriented x 3, Strength and sensation are intact Psych: euthymic mood, full affect   Wt Readings from Last 3 Encounters:  11/12/17 157 lb 1.9 oz (71.3 kg)  08/07/17 157 lb (71.2 kg)  07/02/17 156 lb (70.8 kg)      Studies/Labs Reviewed:   EKG: 11/12/2017-sinus bradycardia 59 incomplete right bundle branch block no  other abnormalities personally viewed-prior 11/08/16-sinus bradycardia first-degree AV block and complete right bundle branch block heart rate 56 bpm PR interval 226 ms. Personally viewed   Recent Labs: No results found for requested labs within last 8760 hours.   Lipid Panel    Component Value Date/Time   CHOL 141 04/07/2016 0837   TRIG 116 04/07/2016 0837   HDL 51 04/07/2016 0837   CHOLHDL 2.8 04/07/2016 0837   VLDL 23 04/07/2016 0837   LDLCALC 67 04/07/2016 0837    Additional studies/ records that were reviewed today include:   Cardiac cath 02/01/2016 Conclusion     The left ventricular systolic function is normal.  LV end diastolic pressure is normal.  The left ventricular ejection fraction is 55-65% by visual estimate.  Lat 1st Diag lesion, 90 %stenosed.  1. Angiographically normal RCA and left circumflex 2. Angiographically normal proximal LAD with distal tapering of the diagonal branch suspicious for spontaneous coronary artery dissection 3. Normal LV function with subtle apical hypokinesis  Medical therapy, echo, and carotid/renal artery duplex to evaluate for other vascular anomalies such as FMD.     Renal US 02/02/2016 Summary:  - No evidence of renal artery stenosis noted bilaterally. - Bilateral normal intrarenal resistive indices.   Carotid US 02/02/2016 Summary:  - No significant extracranial carotid artery stenosis demonstrated. Vertebrals are patent with antegrade flow. - Elevated velocities noted at mid ICA  bilaterally, but appears to be due to tortuosity. Other etiology could be FMD.   Echo 02/02/2016 LV EF: 60% - 65%  ------------------------------------------------------------------- Indications: Chest pain 786.51.  ------------------------------------------------------------------- History: PMH: No prior cardiac history.  ------------------------------------------------------------------- Study Conclusions  - Left ventricle: The cavity size was normal. Systolic function was normal. The estimated ejection fraction was in the range of 60% to 65%. Wall motion was normal; there were no regional wall motion abnormalities. Doppler parameters are consistent with abnormal left ventricular relaxation (grade 1 diastolic dysfunction). There was no evidence of elevated ventricular filling pressure by Doppler parameters. - Aortic valve: There was mild regurgitation. - Aortic root: The aortic root was normal in size. - Ascending aorta: The ascending aorta was normal in size. - Mitral valve: Structurally normal valve. - Left atrium: The atrium was normal in size. - Right ventricle: The cavity size was normal. Wall thickness was normal. Systolic function was normal. - Tricuspid valve: There was mild regurgitation. - Pulmonary arteries: Systolic pressure was within the normal range. - Inferior vena cava: The vessel was normal in size. - Pericardium, extracardiac: There was no pericardial effusion.  ASSESSMENT:    1. Coronary artery dissection   2. Benign essential HTN   3. Pure hypercholesterolemia      PLAN:  In order of problems listed above:  Non-ST elevation myocardial infarction-spontaneous coronary artery dissection of diagonal branch  - Low level troponin 0.2  - Normal ejection fraction 60%.  - Continue aspirin 81 mg.  - Continue statin, we will stop low-dose beta blocker she had first-degree AV block at one point -Off of low-dose  lisinopril  Essential hypertension  - Currently well controlled  -Stopping 12.5 of metoprolol.  - OK to monitor at home periodically.   Hyperlipidemia  - LDL 67 now on Lipitor 10 mg. Continue.  Plaque stabilization.  - Liver functions normal. no changes. No myalgias. She is not interested in any high-intensity statin dose.  No changes made.  2-year follow-up  Medication Adjustments/Labs and Tests Ordered: Current medicines are reviewed at length with the patient today.  Concerns regarding medicines are outlined above.  Medication changes, Labs and Tests ordered today are listed in the Patient Instructions below. Patient Instructions  Medication Instructions:  You may discontinue your Metoprolol.  Continue all other medications as listed.  Follow-Up: Follow up in 2 years with Dr. Anne Fu.  You will receive a letter in the mail 2 months before you are due.  Please call us when you receive this letter to schedule your follow up appointment.  If you need a refill on your cardiac medications before your next appointment, please call your pharmacy.  Thank you for choosing [facility]!!        Signed, Donato Schultz, MD  11/12/2017 8:58 AM    [facility] [address] [place], [place], [place]  [address] Phone: [phone]; Fax: [phone]

## 2017-11-12 NOTE — Patient Instructions (Addendum)
Medication Instructions:  You may discontinue your Metoprolol.  Continue all other medications as listed.  Follow-Up: Follow up in 2 years with Dr. Anne Fu.  You will receive a letter in the mail 2 months before you are due.  Please call us when you receive this letter to schedule your follow up appointment.  If you need a refill on your cardiac medications before your next appointment, please call your pharmacy.  Thank you for choosing [facility]!!

## 2018-01-24 ENCOUNTER — Other Ambulatory Visit: Payer: Self-pay | Admitting: Cardiology

## 2018-04-09 ENCOUNTER — Other Ambulatory Visit: Payer: Self-pay | Admitting: Obstetrics and Gynecology

## 2018-04-09 DIAGNOSIS — Z1231 Encounter for screening mammogram for malignant neoplasm of breast: Secondary | ICD-10-CM

## 2018-05-08 ENCOUNTER — Encounter

## 2018-05-10 IMAGING — MG DIGITAL SCREENING BILATERAL MAMMOGRAM WITH CAD
4 series · 4 of 4 positions shown · non-contrast
Comparison: Previous exam(s).

CLINICAL DATA: Screening.

EXAM:
DIGITAL SCREENING BILATERAL MAMMOGRAM WITH CAD

[R MLO]
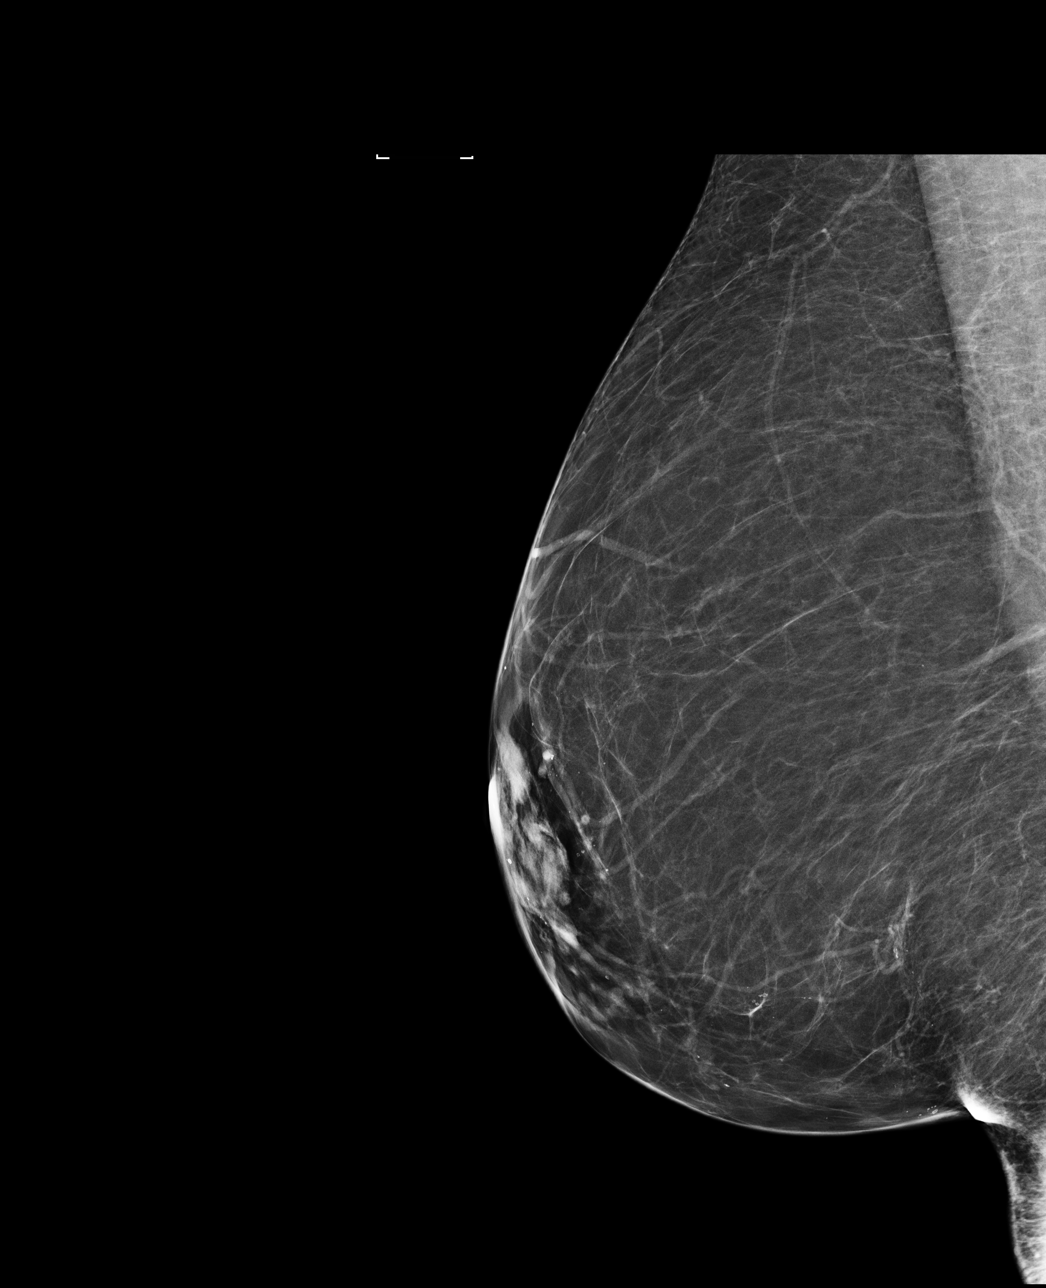

[L CC]
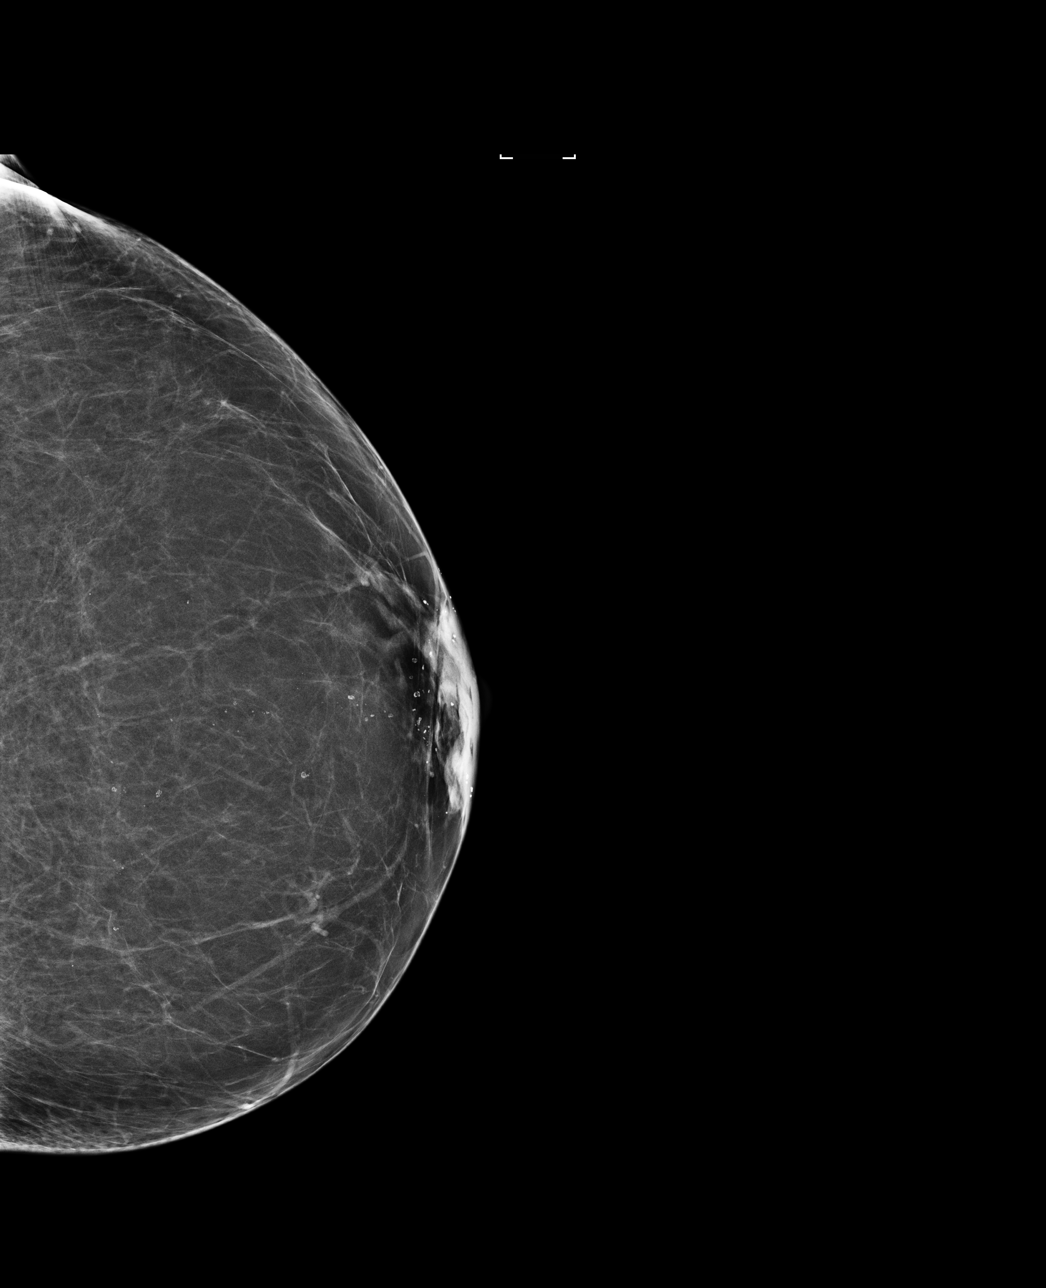

[L MLO]
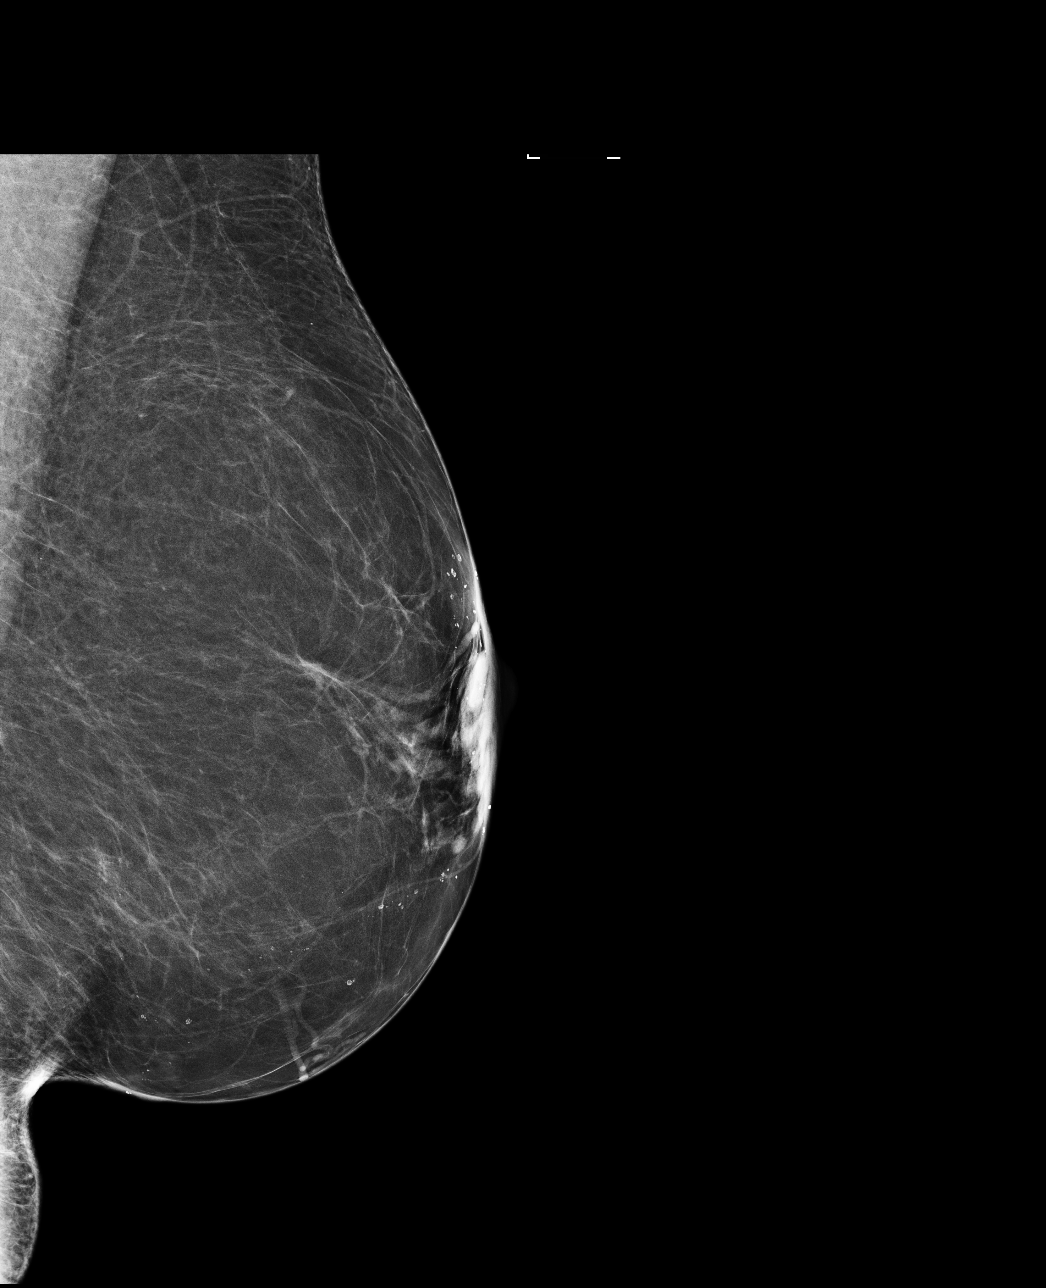

[R CC]
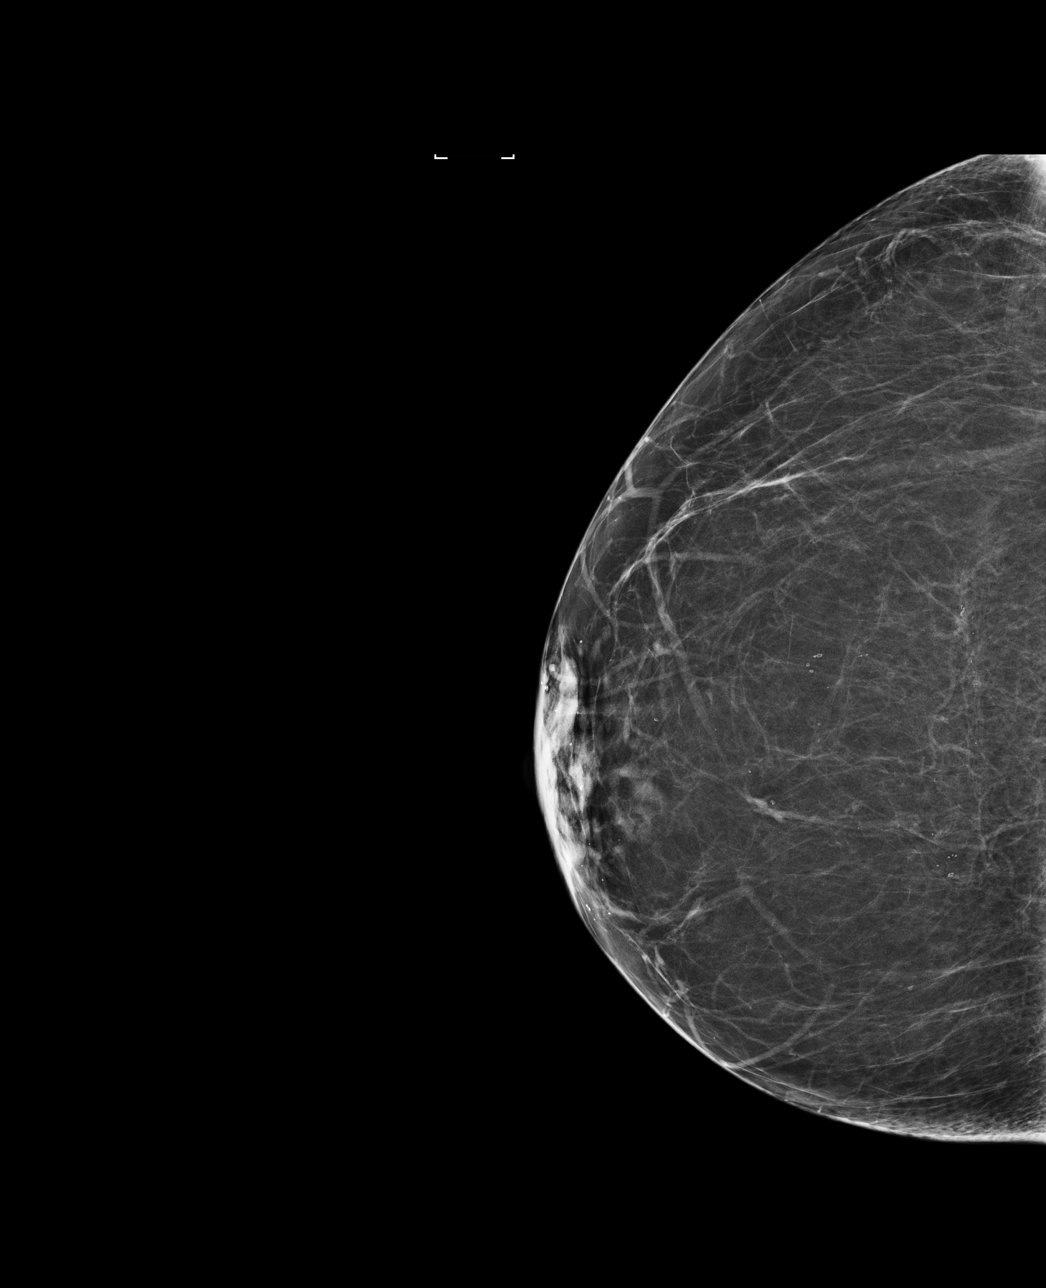

[4 of 4 positions shown; findings below may reference images not displayed]

ACR Breast Density Category b: There are scattered areas of
fibroglandular density.
FINDINGS: There are no findings suspicious for malignancy. Images were
processed with CAD.
IMPRESSION: No mammographic evidence of malignancy. A result letter of this
screening mammogram will be mailed directly to the patient.

RECOMMENDATION:
Screening mammogram in one year. (Code:AS-G-LCT)

BI-RADS CATEGORY  1: Negative.

## 2018-05-21 ENCOUNTER — Ambulatory Visit
Admission: RE | Admit: 2018-05-21 | Discharge: 2018-05-21 | Disposition: A | Discharge status: Home / Self Care | Payer: 59 | Source: Ambulatory Visit | Attending: Obstetrics and Gynecology | Admitting: Obstetrics and Gynecology

## 2018-05-21 DIAGNOSIS — Z1231 Encounter for screening mammogram for malignant neoplasm of breast: Secondary | ICD-10-CM

## 2018-05-22 ENCOUNTER — Ambulatory Visit: Payer: 59

## 2018-08-12 ENCOUNTER — Other Ambulatory Visit (HOSPITAL_COMMUNITY)
Admission: RE | Admit: 2018-08-12 | Discharge: 2018-08-12 | Disposition: A | Discharge status: Home / Self Care | Payer: 59 | Source: Ambulatory Visit | Attending: Obstetrics and Gynecology | Admitting: Obstetrics and Gynecology

## 2018-08-12 ENCOUNTER — Ambulatory Visit (INDEPENDENT_AMBULATORY_CARE_PROVIDER_SITE_OTHER): Payer: 59 | Admitting: Obstetrics and Gynecology

## 2018-08-12 ENCOUNTER — Other Ambulatory Visit: Payer: Self-pay

## 2018-08-12 ENCOUNTER — Encounter: Payer: Self-pay | Admitting: Obstetrics and Gynecology

## 2018-08-12 VITALS — BP 100/78 | HR 68 | Resp 16 | Ht 62.5 in | Wt 161.6 lb

## 2018-08-12 DIAGNOSIS — Z01419 Encounter for gynecological examination (general) (routine) without abnormal findings: Secondary | ICD-10-CM | POA: Diagnosis not present

## 2018-08-12 MED ORDER — VALACYCLOVIR HCL 500 MG PO TABS: 500.0000 mg | ORAL_TABLET | Freq: Every day | ORAL | 3 refills | Status: DC

## 2018-08-12 NOTE — Progress Notes (Signed)
58 y.o. G33P0020 Separated Caucasian female here for annual exam.   Vaginal dryness with intercourse. Had one episode of bleeding with intercourse some months ago.  Same partner.    PCP:  None   Patient's last menstrual period was 06/12/2009.           Sexually active: Yes.   female The current method of family planning is post menopausal status.    Exercising: Yes.    walking Smoker:  no  Health Maintenance: Pap: 05-11-16 Neg:Neg HR HPV, 01-17-13 Neg:Neg HR HPV History of abnormal Pap:  no MMG:  05-21-18 3D Neg/density B/BiRads1 Colonoscopy:  05-12-11 diverticulosis/nl;next due 04/2021 BMD:   n/a  Result  n/a TDaP:  02-09-14 Gardasil:   no HIV: 08-07-17 NR Hep C: 08-07-17 Neg Screening Labs:   ---- Flu vaccine:  Recommended.    reports that she has never smoked. She has never used smokeless tobacco. She reports current alcohol use of about 3.0 - 5.0 standard drinks of alcohol per week. She reports that she does not use drugs.  Past Medical History:  Diagnosis Date  . CAD (coronary artery disease)    a. 01/2016: NSTEMI with 90% stenosis along D1 suspicious for SCAD. Otherwise, normal cors.   . Cat allergies   . Diverticulosis 04/2011  . Heart attack (HCC) 01/31/2016  . HLD (hyperlipidemia)   . HSV-1 infection   . HTN (hypertension)   . Toenail fungus     Past Surgical History:  Procedure Laterality Date  . bilateral breast reduction  1982  . CARDIAC CATHETERIZATION N/A 02/01/2016   Procedure: Left Heart Cath and Coronary Angiography;  Surgeon: Yvonne Kendall, MD;  Location: [facility] INVASIVE CV LAB;  Service: Cardiovascular;  Laterality: N/A;  . COLONOSCOPY  2013  . ENDOMETRIAL BIOPSY  10/2009   benign  . REDUCTION MAMMAPLASTY Bilateral 1986    Current Outpatient Medications  Medication Sig Dispense Refill  . aspirin EC 81 MG EC tablet Take 1 tablet (81 mg total) by mouth daily.    Marland Kitchen atorvastatin (LIPITOR) 10 MG tablet TAKE ONE TABLET BY MOUTH DAILY AT 6PM 90 tablet 3  .  B Complex Vitamins (VITAMIN B COMPLEX) CAPS Take by mouth daily.      . Cetirizine HCl (ZYRTEC PO) Take 1 tablet by mouth as needed.     . fish oil-omega-3 fatty acids 1000 MG capsule Take 2 g by mouth daily.    . Multiple Vitamin (MULTIVITAMIN) tablet Take 1 tablet by mouth daily.      . nitroGLYCERIN (NITROSTAT) 0.4 MG SL tablet Place 1 tablet (0.4 mg total) under the tongue every 5 (five) minutes x 3 doses as needed for chest pain. 25 tablet 3  . Polyethyl Glycol-Propyl Glycol (SYSTANE FREE OP) Place 1 drop into both eyes daily as needed. For dry eyes    . valACYclovir (VALTREX) 500 MG tablet Take 1 tablet (500 mg total) by mouth daily. (Patient taking differently: Take 500 mg by mouth as needed. ) 30 tablet 5  . vitamin C (ASCORBIC ACID) 500 MG tablet Take 500 mg by mouth daily.       No current facility-administered medications for this visit.     Family History  Problem Relation Age of Onset  . Hypertension Father   . Heart disease Father   . Diabetes Father   . Cancer Mother        unknown primary  . Thyroid disease Mother        hyperthyroid  . Thyroid  disease Sister        hypothyroid  . Colon cancer Neg Hx 79       Dec cancer unknown origin  . Esophageal cancer Neg Hx   . Stomach cancer Neg Hx   . Breast cancer Neg Hx     Review of Systems  All other systems reviewed and are negative.   Exam:   BP 100/78 (BP Location: Right Arm, Patient Position: Sitting, Cuff Size: Normal)   Pulse 68   Resp 16   Ht 5' 2.5" (1.588 m)   Wt 161 lb 9.6 oz (73.3 kg)   LMP 06/12/2009   BMI 29.09 kg/m     General appearance: alert, cooperative and appears stated age Head: Normocephalic, without obvious abnormality, atraumatic Neck: no adenopathy, supple, symmetrical, trachea midline and thyroid normal to inspection and palpation Lungs: clear to auscultation bilaterally Breasts:  Scars consistent with breast reduction bilaterally, no masses or tenderness, No nipple retraction or  dimpling, No nipple discharge or bleeding, No axillary or supraclavicular adenopathy Heart: regular rate and rhythm Abdomen: soft, non-tender; no masses, no organomegaly Extremities: extremities normal, atraumatic, no cyanosis or edema Skin: Skin color, texture, turgor normal. No rashes or lesions Lymph nodes: Cervical, supraclavicular, and axillary nodes normal. No abnormal inguinal nodes palpated Neurologic: Grossly normal  Pelvic: External genitalia:  no lesions              Urethra:  normal appearing urethra with no masses, tenderness or lesions              Bartholins and Skenes: normal                 Vagina: normal appearing vagina with normal color and discharge, atrophy noted with placement of speculum.              Cervix: no lesions              Pap taken: Yes.   Bimanual Exam:  Uterus:  normal size, contour, position, consistency, mobility, non-tender              Adnexa: no mass, fullness, tenderness              Rectal exam: Yes.  .  Confirms.              Anus:  normal sphincter tone, no lesions  Chaperone was present for exam.  Assessment:   Well woman visit with normal exam. Vaginal atrophy.  HSV I.  Hx bilateral breast reduction.  Hx MI.  Plan: Mammogram screening. Recommended self breast awareness. Pap and HR HPV as above. Guidelines for Calcium, Vitamin D, regular exercise program including cardiovascular and weight bearing exercise. Refill of Valtrex.  We discussed water based lubricants, cooking oils, and vit E.  List of PCPs to patient.  I recommend an internist.  Follow up annually and prn.   After visit summary provided.

## 2018-08-12 NOTE — Patient Instructions (Signed)

## 2018-08-13 LAB — CYTOLOGY - PAP
Diagnosis: NEGATIVE
HPV: NOT DETECTED

## 2019-07-02 ENCOUNTER — Other Ambulatory Visit: Payer: Self-pay | Admitting: Obstetrics and Gynecology

## 2019-07-02 DIAGNOSIS — Z1231 Encounter for screening mammogram for malignant neoplasm of breast: Secondary | ICD-10-CM

## 2019-08-05 ENCOUNTER — Ambulatory Visit
Admission: RE | Admit: 2019-08-05 | Discharge: 2019-08-05 | Disposition: A | Discharge status: Home / Self Care | Payer: 59 | Source: Ambulatory Visit | Attending: Obstetrics and Gynecology | Admitting: Obstetrics and Gynecology

## 2019-08-05 ENCOUNTER — Other Ambulatory Visit: Payer: Self-pay

## 2019-08-05 DIAGNOSIS — Z1231 Encounter for screening mammogram for malignant neoplasm of breast: Secondary | ICD-10-CM

## 2019-08-15 ENCOUNTER — Ambulatory Visit: Payer: 59 | Admitting: Obstetrics and Gynecology

## 2019-08-18 ENCOUNTER — Other Ambulatory Visit: Payer: Self-pay

## 2019-08-18 ENCOUNTER — Encounter: Payer: Self-pay | Admitting: Obstetrics and Gynecology

## 2019-08-18 ENCOUNTER — Ambulatory Visit (INDEPENDENT_AMBULATORY_CARE_PROVIDER_SITE_OTHER): Payer: 59 | Admitting: Obstetrics and Gynecology

## 2019-08-18 VITALS — BP 124/82 | HR 66 | Temp 97.0°F | Resp 14 | Ht 62.5 in | Wt 156.0 lb

## 2019-08-18 DIAGNOSIS — Z01419 Encounter for gynecological examination (general) (routine) without abnormal findings: Secondary | ICD-10-CM | POA: Diagnosis not present

## 2019-08-18 MED ORDER — VALACYCLOVIR HCL 500 MG PO TABS: 500.0000 mg | ORAL_TABLET | Freq: Two times a day (BID) | ORAL | 1 refills | Status: DC

## 2019-08-18 NOTE — Patient Instructions (Signed)

## 2019-08-18 NOTE — Progress Notes (Signed)
59 y.o. G86P0020 Separated Caucasian female here for annual exam.    No bleeding or spotting.  No vaginal discharge.   Niece has Covid.   Working from home for the last year.   PCP: None   Cardiology:  Dr. Marlou Porch.  Patient's last menstrual period was 06/12/2009.           Sexually active: No.  The current method of family planning is post menopausal status.    Exercising: Yes.    walking and biking Smoker:  no  Health Maintenance: Pap: 08-12-18 Neg:Neg HR HPV, 05-11-16 Neg:Neg HR HPV, 01-17-13 Neg:Neg HR HPV History of abnormal Pap:  no MMG: 2-23-21Neg/density B/BiRads1 Colonoscopy: 05-12-11 diverticulosis/nl;next due 04/2021 BMD:   n/a  Result  n/a TDaP:  02-09-14 Gardasil:   no HIV: 08-07-17 NR Hep C: 08-07-17 Neg Screening Labs:  Will establish care with PCP.    reports that she has never smoked. She has never used smokeless tobacco. She reports current alcohol use of about 3.0 - 5.0 standard drinks of alcohol per week. She reports that she does not use drugs.  Past Medical History:  Diagnosis Date  . CAD (coronary artery disease)    a. 01/2016: NSTEMI with 90% stenosis along D1 suspicious for SCAD. Otherwise, normal cors.   . Cat allergies   . Diverticulosis 04/2011  . Heart attack ([place]) 01/31/2016  . HLD (hyperlipidemia)   . HSV-1 infection   . HTN (hypertension)   . Toenail fungus     Past Surgical History:  Procedure Laterality Date  . bilateral breast reduction  1982  . CARDIAC CATHETERIZATION N/A 02/01/2016   Procedure: Left Heart Cath and Coronary Angiography;  Surgeon: Nelva Bush, MD;  Location: [place] CV LAB;  Service: Cardiovascular;  Laterality: N/A;  . COLONOSCOPY  2013  . ENDOMETRIAL BIOPSY  10/2009   benign  . REDUCTION MAMMAPLASTY Bilateral 1986    Current Outpatient Medications  Medication Sig Dispense Refill  . aspirin EC 81 MG EC tablet Take 1 tablet (81 mg total) by mouth daily.    . B Complex Vitamins (VITAMIN B COMPLEX) CAPS Take  by mouth daily.      . Cetirizine HCl (ZYRTEC PO) Take 1 tablet by mouth as needed.     . fish oil-omega-3 fatty acids 1000 MG capsule Take 2 g by mouth daily.    . Multiple Vitamin (MULTIVITAMIN) tablet Take 1 tablet by mouth daily.      . nitroGLYCERIN (NITROSTAT) 0.4 MG SL tablet Place 1 tablet (0.4 mg total) under the tongue every 5 (five) minutes x 3 doses as needed for chest pain. 25 tablet 3  . Polyethyl Glycol-Propyl Glycol (SYSTANE FREE OP) Place 1 drop into both eyes daily as needed. For dry eyes    . vitamin C (ASCORBIC ACID) 500 MG tablet Take 500 mg by mouth daily.       No current facility-administered medications for this visit.    Family History  Problem Relation Age of Onset  . Hypertension Father   . Heart disease Father   . Diabetes Father   . Cancer Mother        unknown primary  . Thyroid disease Mother        hyperthyroid  . Thyroid disease Sister        hypothyroid  . Colon cancer Neg Hx 79       Dec cancer unknown origin  . Esophageal cancer Neg Hx   . Stomach cancer Neg Hx   .  Breast cancer Neg Hx     Review of Systems  All other systems reviewed and are negative.   Exam:   BP 124/82   Pulse 66   Temp (!) 97 F (36.1 C) (Temporal)   Resp 14   Ht 5' 2.5" (1.588 m)   Wt 156 lb (70.8 kg)   LMP 06/12/2009   BMI 28.08 kg/m     General appearance: alert, cooperative and appears stated age Head: normocephalic, without obvious abnormality, atraumatic Neck: no adenopathy, supple, symmetrical, trachea midline and thyroid normal to inspection and palpation Lungs: clear to auscultation bilaterally Breasts:  Scars consistent with bilateral reduction,  no masses or tenderness, No nipple retraction or dimpling, No nipple discharge or bleeding, No axillary adenopathy Heart: regular rate and rhythm Abdomen: soft, non-tender; no masses, no organomegaly Extremities: extremities normal, atraumatic, no cyanosis or edema Skin: skin color, texture, turgor normal.  No rashes or lesions Lymph nodes: cervical, supraclavicular, and axillary nodes normal. Neurologic: grossly normal  Pelvic: External genitalia:  no lesions              No abnormal inguinal nodes palpated.              Urethra:  normal appearing urethra with no masses, tenderness or lesions              Bartholins and Skenes: normal                 Vagina:  Atrophy noted with orange discharge.               Cervix: no lesions              Pap taken: No. Bimanual Exam:  Uterus:  normal size, contour, position, consistency, mobility, non-tender              Adnexa: no mass, fullness, tenderness              Rectal exam: Yes.  .  Confirms.              Anus:  normal sphincter tone, no lesions  Chaperone was present for exam.  Assessment:   Well woman visit with normal exam. Vaginal atrophy.  HSV I.  Genital. Vaginal atrophy. Hx bilateral breast reduction.  Hx MI.  Plan: Mammogram screening discussed. Self breast awareness reviewed. Pap and HR HPV as above. Guidelines for Calcium, Vitamin D, regular exercise program including cardiovascular and weight bearing exercise. Rx for Valtrex. She declines Rx for vaginal atrophy.  She will establish care with a PCP.  Follow up annually and prn.   After visit summary provided.

## 2019-11-13 ENCOUNTER — Encounter: Payer: Self-pay | Admitting: Cardiology

## 2019-11-19 ENCOUNTER — Ambulatory Visit: Payer: 59 | Admitting: Cardiology

## 2020-01-08 ENCOUNTER — Encounter: Payer: Self-pay | Admitting: Cardiology

## 2020-01-08 ENCOUNTER — Telehealth: Payer: Self-pay | Admitting: Cardiology

## 2020-01-08 ENCOUNTER — Ambulatory Visit (INDEPENDENT_AMBULATORY_CARE_PROVIDER_SITE_OTHER): Payer: 59 | Admitting: Cardiology

## 2020-01-08 ENCOUNTER — Other Ambulatory Visit: Payer: Self-pay

## 2020-01-08 VITALS — BP 138/90 | HR 70 | Ht 62.5 in | Wt 152.8 lb

## 2020-01-08 DIAGNOSIS — E7800 Pure hypercholesterolemia, unspecified: Secondary | ICD-10-CM | POA: Diagnosis not present

## 2020-01-08 DIAGNOSIS — Z79899 Other long term (current) drug therapy: Secondary | ICD-10-CM

## 2020-01-08 DIAGNOSIS — I2542 Coronary artery dissection: Secondary | ICD-10-CM

## 2020-01-08 MED ORDER — ROSUVASTATIN CALCIUM 10 MG PO TABS: 10.0000 mg | ORAL_TABLET | Freq: Every day | ORAL | 3 refills | Status: DC

## 2020-01-08 NOTE — Progress Notes (Signed)
Cardiology Office Note    Date:  01/08/2020   ID:  Leslie Roberts, DOB 1960-08-08, MRN [id number]  PCP:  Patient, No Pcp Per  Cardiologist:   Donato Schultz, MD     History of Present Illness:  Leslie Roberts is a 59 y.o. female with NSTEMI 01/31/16 Secondary to spontaneous coronary artery dissection here for follow up.   Troponin was initially mildly elevated at 0.23. EKG showed T-wave inversion in V1 through V3. Lipid panel showed mildly elevated LDL 102, cholesterol 169, HDL 48, triglyceride 95. Hemoglobin A1c 5.4.   Cardiac catheterization was performed on 02/01/2016, showed EF 55-65%, 90% lateral D1 stenosis, otherwise normal coronaries. The distal tapering of the diagonal branch was suspicious for spontaneous coronary dissection. Medical therapy was recommended.   Carotid renal artery ultrasound was obtained to rule out other vascular abnormality. Renal artery duplex showed no evidence of renal artery stenosis, bilateral normal intrarenal resistive indices. Carotid ultrasound also did not reveal significant carotid artery stenosis, there was elevated velocity noted in mid ICA bilaterally, however appears to be tortuosity. Echocardiogram obtained on 02/02/2016 showed EF 60-65%, grade 1 diastolic dysfunction, mild AR, mild TR.   Doing very well, no chest pain, no shortness of breath, no syncopal. Enjoying Zumba. We discussed ketogenic diet.  11/12/2017-overall doing quite well, no fevers chills nausea vomiting syncope bruising chest pain. Walking.  Going on a trip to [place], visiting 6 different parks.  Busy trip.  Overall doing quite well.  No chest pain.  We decided to stop her metoprolol.  01/08/2020-here for the follow-up of prior non-ST elevation myocardial infarction with 90% diagonal spontaneous coronary artery dissection.  Overall doing quite well.  No fevers chills nausea vomiting syncope bleeding.  Tolerant with all medications.  Previously not interested in high dose statin.  LDL  overall excellent.  Enjoys walking, biking.  [address].   Past Medical History:  Diagnosis Date  . CAD (coronary artery disease)    a. 01/2016: NSTEMI with 90% stenosis along D1 suspicious for SCAD. Otherwise, normal cors.   . Cat allergies   . Diverticulosis 04/2011  . Heart attack (HCC) 01/31/2016  . HLD (hyperlipidemia)   . HSV-1 infection   . HTN (hypertension)   . Toenail fungus     Past Surgical History:  Procedure Laterality Date  . bilateral breast reduction  1982  . CARDIAC CATHETERIZATION N/A 02/01/2016   Procedure: Left Heart Cath and Coronary Angiography;  Surgeon: Yvonne Kendall, MD;  Location: [facility] INVASIVE CV LAB;  Service: Cardiovascular;  Laterality: N/A;  . COLONOSCOPY  2013  . ENDOMETRIAL BIOPSY  10/2009   benign  . REDUCTION MAMMAPLASTY Bilateral 1986    Current Medications: Outpatient Medications Prior to Visit  Medication Sig Dispense Refill  . aspirin EC 81 MG EC tablet Take 1 tablet (81 mg total) by mouth daily.    . B Complex Vitamins (VITAMIN B COMPLEX) CAPS Take by mouth daily.      . Cetirizine HCl (ZYRTEC PO) Take 1 tablet by mouth as needed.     . fish oil-omega-3 fatty acids 1000 MG capsule Take 2 g by mouth daily.    . Multiple Vitamin (MULTIVITAMIN) tablet Take 1 tablet by mouth daily.      . nitroGLYCERIN (NITROSTAT) 0.4 MG SL tablet Place 1 tablet (0.4 mg total) under the tongue every 5 (five) minutes x 3 doses as needed for chest pain. 25 tablet 3  . Polyethyl Glycol-Propyl Glycol (SYSTANE FREE OP) Place 1 drop into  both eyes daily as needed. For dry eyes    . valACYclovir (VALTREX) 500 MG tablet Take 1 tablet (500 mg total) by mouth 2 (two) times daily. Take for 3 days as needed. 30 tablet 1  . vitamin C (ASCORBIC ACID) 500 MG tablet Take 500 mg by mouth daily.       No facility-administered medications prior to visit.     Allergies:   Patient has no known allergies.   Social History   Socioeconomic History  . Marital status:  Married    Spouse name: Not on file  . Number of children: Not on file  . Years of education: Not on file  . Highest education level: Not on file  Occupational History  . Not on file  Tobacco Use  . Smoking status: Never Smoker  . Smokeless tobacco: Never Used  Vaping Use  . Vaping Use: Never used  Substance and Sexual Activity  . Alcohol use: Yes    Alcohol/week: 3.0 - 5.0 standard drinks    Types: 3 - 5 Glasses of wine per week    Comment: 4-6 drinks a week (wine or beer)  . Drug use: No  . Sexual activity: Not Currently    Partners: Male    Birth control/protection: Post-menopausal  Other Topics Concern  . Not on file  Social History Narrative  . Not on file   Social Determinants of Health   Financial Resource Strain:   . Difficulty of Paying Living Expenses:   Food Insecurity:   . Worried About Programme researcher, broadcasting/film/video in the Last Year:   . Barista in the Last Year:   Transportation Needs:   . Freight forwarder (Medical):   Marland Kitchen Lack of Transportation (Non-Medical):   Physical Activity:   . Days of Exercise per Week:   . Minutes of Exercise per Session:   Stress:   . Feeling of Stress :   Social Connections:   . Frequency of Communication with Friends and Family:   . Frequency of Social Gatherings with Friends and Family:   . Attends Religious Services:   . Active Member of Clubs or Organizations:   . Attends Banker Meetings:   Marland Kitchen Marital Status:      Family History:  The patient's family history includes Cancer in her mother; Diabetes in her father; Heart disease in her father; Hypertension in her father; Thyroid disease in her mother and sister.   ROS:   Please see the history of present illness.    Review of Systems  All other systems reviewed and are negative.     PHYSICAL EXAM:   VS:  BP (!) 138/90   Pulse 70   Ht 5' 2.5" (1.588 m)   Wt 152 lb 12.8 oz (69.3 kg)   LMP 06/12/2009   BMI 27.50 kg/m    GEN: Well nourished, well  developed, in no acute distress  HEENT: normal  Neck: no JVD, carotid bruits, or masses Cardiac: RRR; no murmurs, rubs, or gallops,no edema  Respiratory:  clear to auscultation bilaterally, normal work of breathing GI: soft, nontender, nondistended, + BS MS: no deformity or atrophy  Skin: warm and dry, no rash Neuro:  Alert and Oriented x 3, Strength and sensation are intact Psych: euthymic mood, full affect   Wt Readings from Last 3 Encounters:  01/08/20 152 lb 12.8 oz (69.3 kg)  08/18/19 156 lb (70.8 kg)  08/12/18 161 lb 9.6 oz (73.3 kg)  Studies/Labs Reviewed:   EKG: 01/08/2020-sinus rhythm 70 no other abnormalities.  11/12/2017-sinus bradycardia 59 incomplete right bundle branch block no other abnormalities personally viewed-prior 11/08/16-sinus bradycardia first-degree AV block and complete right bundle branch block heart rate 56 bpm PR interval 226 ms. Personally viewed   Recent Labs: No results found for requested labs within last 8760 hours.   Lipid Panel    Component Value Date/Time   CHOL 141 04/07/2016 0837   TRIG 116 04/07/2016 0837   HDL 51 04/07/2016 0837   CHOLHDL 2.8 04/07/2016 0837   VLDL 23 04/07/2016 0837   LDLCALC 67 04/07/2016 0837    Additional studies/ records that were reviewed today include:   Cardiac cath 02/01/2016 Conclusion     The left ventricular systolic function is normal.  LV end diastolic pressure is normal.  The left ventricular ejection fraction is 55-65% by visual estimate.  Lat 1st Diag lesion, 90 %stenosed.  1. Angiographically normal RCA and left circumflex 2. Angiographically normal proximal LAD with distal tapering of the diagonal branch suspicious for spontaneous coronary artery dissection 3. Normal LV function with subtle apical hypokinesis  Medical therapy, echo, and carotid/renal artery duplex to evaluate for other vascular anomalies such as FMD.   Diagnostic Dominance: Right    Renal US  02/02/2016 Summary:  - No evidence of renal artery stenosis noted bilaterally. - Bilateral normal intrarenal resistive indices.   Carotid US 02/02/2016 Summary:  - No significant extracranial carotid artery stenosis demonstrated. Vertebrals are patent with antegrade flow. - Elevated velocities noted at mid ICA bilaterally, but appears to be due to tortuosity. Other etiology could be FMD.   Echo 02/02/2016 LV EF: 60% - 65%  ------------------------------------------------------------------- Indications: Chest pain 786.51.  ------------------------------------------------------------------- History: PMH: No prior cardiac history.  ------------------------------------------------------------------- Study Conclusions  - Left ventricle: The cavity size was normal. Systolic function was normal. The estimated ejection fraction was in the range of 60% to 65%. Wall motion was normal; there were no regional wall motion abnormalities. Doppler parameters are consistent with abnormal left ventricular relaxation (grade 1 diastolic dysfunction). There was no evidence of elevated ventricular filling pressure by Doppler parameters. - Aortic valve: There was mild regurgitation. - Aortic root: The aortic root was normal in size. - Ascending aorta: The ascending aorta was normal in size. - Mitral valve: Structurally normal valve. - Left atrium: The atrium was normal in size. - Right ventricle: The cavity size was normal. Wall thickness was normal. Systolic function was normal. - Tricuspid valve: There was mild regurgitation. - Pulmonary arteries: Systolic pressure was within the normal range. - Inferior vena cava: The vessel was normal in size. - Pericardium, extracardiac: There was no pericardial effusion.  ASSESSMENT:    1. Spontaneous dissection of coronary artery   2. Pure hypercholesterolemia   3. Medication management      PLAN:  In  order of problems listed above:  Non-ST elevation myocardial infarction-spontaneous coronary artery dissection of diagonal branch  - Low level troponin 0.2  - Normal ejection fraction 60%.  - Continue aspirin 81 mg.  - Restart  statin, we will stop low-dose beta blocker she had first-degree AV block at one point - Off of low-dose lisinopril.  -Currently doing well without any symptoms.  Continue with secondary risk factor prevention.  Essential hypertension  - Currently well controlled  -Stopped previously 12.5 of metoprolol.  - OK to monitor at home periodically.   Hyperlipidemia  - LDL 67 previously on Lipitor 10 mg.  She subsequently  did not obtain new refills.  I will go ahead and prescribe her Crestor 10 mg once a day for plaque stabilization.  Expressed the importance of this for secondary prevention.  She is willing to restart.  - Liver functions normal in the past.  No myalgias. She was not previously interested in any high-intensity statin dose.  Medications reviewed, no changes.  1-year follow-up  Medication Adjustments/Labs and Tests Ordered: Current medicines are reviewed at length with the patient today.  Concerns regarding medicines are outlined above.  Medication changes, Labs and Tests ordered today are listed in the Patient Instructions below. There are no Patient Instructions on file for this visit.   Signed, Donato Schultz, MD  01/08/2020 8:48 AM    [facility] [address] [place], [place], [place]  [address] Phone: [phone]; Fax: [phone]

## 2020-01-08 NOTE — Telephone Encounter (Signed)
Per pt call stated she thinks she should have a lab work before she takes the CRESTOR the new medication.  Please give pt a call back to address her concerns.

## 2020-01-08 NOTE — Telephone Encounter (Signed)
Dr Anne Fu - OK to order lipid panel.  Pt aware and is scheduled.

## 2020-01-08 NOTE — Patient Instructions (Signed)
Medication Instructions:  Please start Crestor 10 mg daily. Continue all other medications as listed.  *If you need a refill on your cardiac medications before your next appointment, please call your pharmacy*  Lab Work: Please have blood work in 3 months (Lipid/ALT) If you have labs (blood work) drawn today and your tests are completely normal, you will receive your results only by: Marland Kitchen MyChart Message (if you have MyChart) OR . A paper copy in the mail If you have any lab test that is abnormal or we need to change your treatment, we will call you to review the results.  Follow-Up: At [facility], you and your health needs are our priority.  As part of our continuing mission to provide you with exceptional heart care, we have created designated Provider Care Teams.  These Care Teams include your primary Cardiologist (physician) and Advanced Practice Providers (APPs -  Physician Assistants and Nurse Practitioners) who all work together to provide you with the care you need, when you need it.  Your next appointment:   12 month(s)  The format for your next appointment:   In Person  Provider:   Donato Schultz, MD  Thank you for choosing [facility]!!

## 2020-01-19 ENCOUNTER — Other Ambulatory Visit: Payer: 59 | Admitting: *Deleted

## 2020-01-19 ENCOUNTER — Other Ambulatory Visit: Payer: Self-pay

## 2020-01-19 DIAGNOSIS — E7800 Pure hypercholesterolemia, unspecified: Secondary | ICD-10-CM

## 2020-01-19 LAB — LIPID PANEL
Chol/HDL Ratio: 3.5 ratio (ref 0.0–4.4)
Cholesterol, Total: 183 mg/dL (ref 100–199)
HDL: 53 mg/dL (ref >39)
LDL Chol Calc (NIH): 102 mg/dL — ABNORMAL HIGH (ref 0–99)
Triglycerides: 160 mg/dL — ABNORMAL HIGH (ref 0–149)
VLDL Cholesterol Cal: 28 mg/dL (ref 5–40)

## 2020-04-07 ENCOUNTER — Telehealth: Payer: Self-pay | Admitting: Cardiology

## 2020-04-07 DIAGNOSIS — Z79899 Other long term (current) drug therapy: Secondary | ICD-10-CM

## 2020-04-07 DIAGNOSIS — E7800 Pure hypercholesterolemia, unspecified: Secondary | ICD-10-CM

## 2020-04-07 NOTE — Telephone Encounter (Signed)
Pt c/o medication issue:  1. Name of Medication: rosuvastatin (CRESTOR) 10 MG tablet  2. How are you currently taking this medication (dosage and times per day)? n/a  3. Are you having a reaction (difficulty breathing--STAT)? No  4. What is your medication issue? Patient had a lab appt scheduled for tomorrow for a lipid panel to follow up with her taking crestor. Patient states that she never started the medication. Appt for tomorrow is cancelled. She wanted to advise Dr. Anne Fu.

## 2020-04-07 NOTE — Telephone Encounter (Signed)
Called patient about her message. Patient stated she has not been taking her Crestor and had to reschedule her lab appointment. Patient stated she will start taking Crestor next week. Rescheduled patient's lab appointment for 07/14/20.   Will send message to Dr. Anne Fu, so he is aware.

## 2020-04-08 NOTE — Telephone Encounter (Signed)
Agree, thanks for update Donato Schultz, MD

## 2020-04-09 ENCOUNTER — Other Ambulatory Visit: Payer: 59

## 2020-07-13 ENCOUNTER — Other Ambulatory Visit: Payer: Self-pay | Admitting: Obstetrics and Gynecology

## 2020-07-13 DIAGNOSIS — Z1231 Encounter for screening mammogram for malignant neoplasm of breast: Secondary | ICD-10-CM

## 2020-07-14 ENCOUNTER — Other Ambulatory Visit: Payer: 59

## 2020-08-23 ENCOUNTER — Ambulatory Visit: Payer: 59 | Admitting: Obstetrics and Gynecology

## 2020-08-26 ENCOUNTER — Ambulatory Visit: Payer: 59

## 2020-09-02 ENCOUNTER — Other Ambulatory Visit: Payer: Self-pay

## 2020-09-02 ENCOUNTER — Ambulatory Visit
Admission: RE | Admit: 2020-09-02 | Discharge: 2020-09-02 | Disposition: A | Discharge status: Home / Self Care | Payer: 59 | Source: Ambulatory Visit | Attending: Obstetrics and Gynecology | Admitting: Obstetrics and Gynecology

## 2020-09-02 DIAGNOSIS — Z1231 Encounter for screening mammogram for malignant neoplasm of breast: Secondary | ICD-10-CM

## 2020-10-06 ENCOUNTER — Other Ambulatory Visit: Payer: 59 | Admitting: *Deleted

## 2020-10-06 ENCOUNTER — Other Ambulatory Visit: Payer: Self-pay

## 2020-10-06 DIAGNOSIS — Z79899 Other long term (current) drug therapy: Secondary | ICD-10-CM

## 2020-10-06 DIAGNOSIS — E7800 Pure hypercholesterolemia, unspecified: Secondary | ICD-10-CM

## 2020-10-06 LAB — LIPID PANEL
Chol/HDL Ratio: 2.2 ratio (ref 0.0–4.4)
Cholesterol, Total: 134 mg/dL (ref 100–199)
HDL: 60 mg/dL (ref >39)
LDL Chol Calc (NIH): 53 mg/dL (ref 0–99)
Triglycerides: 116 mg/dL (ref 0–149)
VLDL Cholesterol Cal: 21 mg/dL (ref 5–40)

## 2020-10-06 LAB — ALT: ALT: 26 IU/L (ref 0–32)

## 2020-10-26 NOTE — Progress Notes (Signed)
60 y.o. G2P0020 separated Caucasian female here for annual exam.    Eating plant based food.  Taking cholesterol medication.   Got a truck to haul her kayak.   No Covid vaccination.   PCP:  None   Patient's last menstrual period was 06/12/2009.           Sexually active: No.  Not in a relationship. The current method of family planning is post menopausal status.    Exercising: Yes.    walks 2.5 miles 5 days/week Smoker:  no  Health Maintenance: Pap: 08-12-18 Neg:Neg HR HPV, 05-11-16 Neg:Neg HR HPV, 01-17-13 Neg:Neg HR HPV History of abnormal Pap:  no MMG:  09-02-20 3D/Neg/BiRads1 Colonoscopy:05-12-11 diverticulosis/nl;next due 04/2021. BMD:   n/a  Result  n/a TDaP:  02-09-14 Gardasil:   no HIV: 08-07-17 NR Hep C: 08-07-17 Neg Screening Labs:  cardiology   reports that she has never smoked. She has never used smokeless tobacco. She reports current alcohol use of about 3.0 - 5.0 standard drinks of alcohol per week. She reports that she does not use drugs.  Past Medical History:  Diagnosis Date  . CAD (coronary artery disease)    a. 01/2016: NSTEMI with 90% stenosis along D1 suspicious for SCAD. Otherwise, normal cors.   . Cat allergies   . Diverticulosis 04/2011  . Heart attack (HCC) 01/31/2016  . HLD (hyperlipidemia)   . HSV-1 infection   . HTN (hypertension)   . Toenail fungus     Past Surgical History:  Procedure Laterality Date  . bilateral breast reduction  1982  . CARDIAC CATHETERIZATION N/A 02/01/2016   Procedure: Left Heart Cath and Coronary Angiography;  Surgeon: Yvonne Kendall, MD;  Location: [facility] INVASIVE CV LAB;  Service: Cardiovascular;  Laterality: N/A;  . COLONOSCOPY  2013  . ENDOMETRIAL BIOPSY  10/2009   benign  . REDUCTION MAMMAPLASTY Bilateral 1986    Current Outpatient Medications  Medication Sig Dispense Refill  . aspirin EC 81 MG EC tablet Take 1 tablet (81 mg total) by mouth daily.    . B Complex Vitamins (VITAMIN B COMPLEX) CAPS Take by mouth  daily.    . Cetirizine HCl (ZYRTEC PO) Take 1 tablet by mouth as needed.     . fish oil-omega-3 fatty acids 1000 MG capsule Take 2 g by mouth daily.    . Multiple Vitamin (MULTIVITAMIN) tablet Take 1 tablet by mouth daily.    Bertram Gala Glycol-Propyl Glycol (SYSTANE FREE OP) Place 1 drop into both eyes daily as needed. For dry eyes    . rosuvastatin (CRESTOR) 10 MG tablet Take 1 tablet (10 mg total) by mouth daily. 90 tablet 3  . valACYclovir (VALTREX) 500 MG tablet Take 1 tablet (500 mg total) by mouth 2 (two) times daily. Take for 3 days as needed. 30 tablet 1  . vitamin C (ASCORBIC ACID) 500 MG tablet Take 500 mg by mouth daily.    . vitamin E 1000 UNIT capsule Take 1,000 Units by mouth daily.    Marland Kitchen zinc gluconate 50 MG tablet Take 50 mg by mouth daily.     No current facility-administered medications for this visit.    Family History  Problem Relation Age of Onset  . Hypertension Father   . Heart disease Father   . Diabetes Father   . Cancer Mother        unknown primary  . Thyroid disease Mother        hyperthyroid  . Thyroid disease Sister  hypothyroid  . Colon cancer Neg Hx 79       Dec cancer unknown origin  . Esophageal cancer Neg Hx   . Stomach cancer Neg Hx   . Breast cancer Neg Hx     Review of Systems  All other systems reviewed and are negative.   Exam:   BP 118/72   Pulse 82   Ht 5' 2.5" (1.588 m)   Wt 150 lb (68 kg)   LMP 06/12/2009   SpO2 98%   BMI 27.00 kg/m     General appearance: alert, cooperative and appears stated age Head: normocephalic, without obvious abnormality, atraumatic Neck: no adenopathy, supple, symmetrical, trachea midline and thyroid normal to inspection and palpation Lungs: clear to auscultation bilaterally Breasts: consistent with reduction, no masses or tenderness, No nipple retraction or dimpling, No nipple discharge or bleeding, No axillary adenopathy Heart: regular rate and rhythm Abdomen: soft, non-tender; no masses,  no organomegaly Extremities: extremities normal, atraumatic, no cyanosis or edema Skin: skin color, texture, turgor normal. No rashes or lesions Lymph nodes: cervical, supraclavicular, and axillary nodes normal. Neurologic: grossly normal  Pelvic: External genitalia:  no lesions              No abnormal inguinal nodes palpated.              Urethra:  normal appearing urethra with no masses, tenderness or lesions              Bartholins and Skenes: normal                 Vagina: normal appearing vagina with erythema of mucosa and greenish discharge, no lesions              Cervix: no lesions              Pap taken: No. Bimanual Exam:  Uterus:  normal size, contour, position, consistency, mobility, non-tender              Adnexa: no mass, fullness, tenderness              Rectal exam: Yes.  .  Confirms.              Anus:  normal sphincter tone, no lesions  Chaperone was present for exam.  Assessment:   Well woman visit with normal exam. Vaginal atrophy.  HSV I. Genital. Hx bilateral breast reduction. Hx MI.  Plan: Mammogram screening discussed. Self breast awareness reviewed. Pap and HR HPV as above. Guidelines for Calcium, Vitamin D, regular exercise program including cardiovascular and weight bearing exercise. Rx Valtrex.  We discussed vaginal atrophy and treatment options with water based lubricants, cooking oils, and vaginal estrogen.  She will call me if she wishes to initiate estrogen treatment.  I did discuss potential effect on breast cancer.  Follow up annually and prn.   After visit summary provided.

## 2020-10-27 ENCOUNTER — Encounter: Payer: Self-pay | Admitting: Obstetrics and Gynecology

## 2020-10-27 ENCOUNTER — Other Ambulatory Visit: Payer: Self-pay

## 2020-10-27 ENCOUNTER — Ambulatory Visit (INDEPENDENT_AMBULATORY_CARE_PROVIDER_SITE_OTHER): Payer: Commercial Managed Care - PPO | Admitting: Obstetrics and Gynecology

## 2020-10-27 VITALS — BP 118/72 | HR 82 | Ht 62.5 in | Wt 150.0 lb

## 2020-10-27 DIAGNOSIS — Z01419 Encounter for gynecological examination (general) (routine) without abnormal findings: Secondary | ICD-10-CM | POA: Diagnosis not present

## 2020-10-27 MED ORDER — VALACYCLOVIR HCL 500 MG PO TABS: 500.0000 mg | ORAL_TABLET | Freq: Two times a day (BID) | ORAL | 1 refills | Status: DC

## 2020-10-27 NOTE — Patient Instructions (Signed)
EXERCISE AND DIET:  We recommended that you start or continue a regular exercise program for good health. Regular exercise means any activity that makes your heart beat faster and makes you sweat.  We recommend exercising at least 30 minutes per day at least 3 days a week, preferably 4 or 5.  We also recommend a diet low in fat and sugar.  Inactivity, poor dietary choices and obesity can cause diabetes, heart attack, stroke, and kidney damage, among others.    ALCOHOL AND SMOKING:  Women should limit their alcohol intake to no more than 7 drinks/beers/glasses of wine (combined, not each!) per week. Moderation of alcohol intake to this level decreases your risk of breast cancer and liver damage. And of course, no recreational drugs are part of a healthy lifestyle.  And absolutely no smoking or even second hand smoke. Most people know smoking can cause heart and lung diseases, but did you know it also contributes to weakening of your bones? Aging of your skin?  Yellowing of your teeth and nails?  CALCIUM AND VITAMIN D:  Adequate intake of calcium and Vitamin D are recommended.  The recommendations for exact amounts of these supplements seem to change often, but generally speaking 600 mg of calcium (either carbonate or citrate) and 800 units of Vitamin D per day seems prudent. Certain women may benefit from higher intake of Vitamin D.  If you are among these women, your doctor will have told you during your visit.    PAP SMEARS:  Pap smears, to check for cervical cancer or precancers,  have traditionally been done yearly, although recent scientific advances have shown that most women can have pap smears less often.  However, every woman still should have a physical exam from her gynecologist every year. It will include a breast check, inspection of the vulva and vagina to check for abnormal growths or skin changes, a visual exam of the cervix, and then an exam to evaluate the size and shape of the uterus and  ovaries.  And after 61 years of age, a rectal exam is indicated to check for rectal cancers. We will also provide age appropriate advice regarding health maintenance, like when you should have certain vaccines, screening for sexually transmitted diseases, bone density testing, colonoscopy, mammograms, etc.   MAMMOGRAMS:  All women over 40 years old should have a yearly mammogram. Many facilities now offer a "3D" mammogram, which may cost around $50 extra out of pocket. If possible,  we recommend you accept the option to have the 3D mammogram performed.  It both reduces the number of women who will be called back for extra views which then turn out to be normal, and it is better than the routine mammogram at detecting truly abnormal areas.    COLONOSCOPY:  Colonoscopy to screen for colon cancer is recommended for all women at age 50.  We know, you hate the idea of the prep.  We agree, BUT, having colon cancer and not knowing it is worse!!  Colon cancer so often starts as a polyp that can be seen and removed at colonscopy, which can quite literally save your life!  And if your first colonoscopy is normal and you have no family history of colon cancer, most women don't have to have it again for 10 years.  Once every ten years, you can do something that may end up saving your life, right?  We will be happy to help you get it scheduled when you are ready.    Be sure to check your insurance coverage so you understand how much it will cost.  It may be covered as a preventative service at no cost, but you should check your particular policy.      Atrophic Vaginitis  Atrophic vaginitis is a condition in which the tissues that line the vagina become dry and thin. This condition is most common in women who have stopped having regular menstrual periods (are in menopause). This usually starts when a woman is 45 to 61 years old. That is the time when a woman's estrogen levels begin to decrease. Estrogen is a female  hormone. It helps to keep the tissues of the vagina moist. It stimulates the vagina to produce a clear fluid that lubricates the vagina for sex. This fluid also protects the vagina from infection. Lack of estrogen can cause the lining of the vagina to get thinner and dryer. The vagina may also shrink in size. It may become less elastic. Atrophic vaginitis tends to get worse over time as a woman's estrogen level drops. What are the causes? This condition is caused by the normal drop in estrogen that happens around the time of menopause. What increases the risk? Certain conditions or situations may lower a woman's estrogen level, leading to a higher risk for atrophic vaginitis. You are more likely to develop this condition if:  You are taking medicines that block estrogen.  You have had your ovaries removed.  You are being treated for cancer with radiation or medicines (chemotherapy).  You have given birth or are breastfeeding.  You are older than age 50.  You smoke. What are the signs or symptoms? Symptoms of this condition include:  Pain, soreness, a feeling of pressure, or bleeding during sex (dyspareunia).  Vaginal burning, irritation, or itching.  Pain or bleeding when a speculum is used in a vaginal exam.  Having burning pain while urinating.  Vaginal discharge. In some cases, there are no symptoms. How is this diagnosed? This condition is diagnosed based on your medical history and a physical exam. This will include a pelvic exam that checks the vaginal tissues. Though rare, you may also have other tests, including:  A urine test.  A test that checks the acid balance in your vagina (acid balance test). How is this treated? Treatment for this condition depends on how severe your symptoms are. Treatment may include:  Using an over-the-counter vaginal lubricant before sex.  Using a long-acting vaginal moisturizer.  Using low-dose estrogen for moderate to severe symptoms  that do not respond to other treatments. Options include creams, tablets, and inserts (vaginal rings). Before you use a vaginal estrogen, tell your health care provider if you have a history of: ? Breast cancer. ? Endometrial cancer. ? Blood clots. If you are not sexually active and your symptoms are very mild, you may not need treatment. Follow these instructions at home: Medicines  Take over-the-counter and prescription medicines only as told by your health care provider.  Do not use herbal or alternative medicines unless your health care provider says that you can.  Use over-the-counter creams, lubricants, or moisturizers for dryness only as told by your health care provider. General instructions  If your atrophic vaginitis is caused by menopause, discuss all of your menopause symptoms and treatment options with your health care provider.  Do not douche.  Do not use products that can make your vagina dry. These include: ? Scented feminine sprays. ? Scented tampons. ? Scented soaps.  Vaginal sex can   help to improve blood flow and elasticity of vaginal tissue. If you choose to have sex and it hurts, try using a water-soluble lubricant or moisturizer right before having sex. Contact a health care provider if:  Your discharge looks different than normal.  Your vagina has an unusual smell.  You have new symptoms.  Your symptoms do not improve with treatment.  Your symptoms get worse. Summary  Atrophic vaginitis is a condition in which the tissues that line the vagina become dry and thin. It is most common in women who have stopped having regular menstrual periods (are in menopause).  Treatment options include using vaginal lubricants and low-dose vaginal estrogen.  Contact a health care provider if your vagina has an unusual smell, or if your symptoms get worse or do not improve after treatment. This information is not intended to replace advice given to you by your health  care provider. Make sure you discuss any questions you have with your health care provider. Document Revised: 11/27/2019 Document Reviewed: 11/27/2019 Elsevier Patient Education  2021 Elsevier Inc.  

## 2021-01-21 ENCOUNTER — Telehealth: Payer: Self-pay | Admitting: Cardiology

## 2021-01-21 NOTE — Telephone Encounter (Signed)
*  STAT* If patient is at the pharmacy, call can be transferred to refill team.   1. Which medications need to be refilled? (please list name of each medication and dose if known) rosuvastatin (CRESTOR) 10 MG tablet   2. Which pharmacy/location (including street and city if local pharmacy) is medication to be sent to?  [facility] [id number] - [place], [place] - [address]  3. Do they need a 30 day or 90 day supply? 90ds

## 2021-01-24 MED ORDER — ROSUVASTATIN CALCIUM 10 MG PO TABS: 10.0000 mg | ORAL_TABLET | Freq: Every day | ORAL | 0 refills | Status: DC

## 2021-02-26 ENCOUNTER — Encounter: Payer: Self-pay | Admitting: *Deleted

## 2021-03-08 ENCOUNTER — Other Ambulatory Visit: Payer: Self-pay

## 2021-03-08 MED ORDER — ROSUVASTATIN CALCIUM 10 MG PO TABS: 10.0000 mg | ORAL_TABLET | Freq: Every day | ORAL | 3 refills | Status: DC

## 2021-06-21 ENCOUNTER — Ambulatory Visit (INDEPENDENT_AMBULATORY_CARE_PROVIDER_SITE_OTHER): Payer: Commercial Managed Care - PPO | Admitting: Cardiology

## 2021-06-21 ENCOUNTER — Encounter: Payer: Self-pay | Admitting: Cardiology

## 2021-06-21 ENCOUNTER — Other Ambulatory Visit: Payer: Self-pay

## 2021-06-21 VITALS — BP 130/90 | HR 64 | Ht 62.5 in | Wt 156.0 lb

## 2021-06-21 DIAGNOSIS — I2542 Coronary artery dissection: Secondary | ICD-10-CM | POA: Diagnosis not present

## 2021-06-21 DIAGNOSIS — I252 Old myocardial infarction: Secondary | ICD-10-CM

## 2021-06-21 DIAGNOSIS — I10 Essential (primary) hypertension: Secondary | ICD-10-CM

## 2021-06-21 DIAGNOSIS — E7800 Pure hypercholesterolemia, unspecified: Secondary | ICD-10-CM | POA: Diagnosis not present

## 2021-06-21 NOTE — Patient Instructions (Signed)
Medication Instructions:  The current medical regimen is effective;  continue present plan and medications.  *If you need a refill on your cardiac medications before your next appointment, please call your pharmacy*  Follow-Up: At [facility], you and your health needs are our priority.  As part of our continuing mission to provide you with exceptional heart care, we have created designated Provider Care Teams.  These Care Teams include your primary Cardiologist (physician) and Advanced Practice Providers (APPs -  Physician Assistants and Nurse Practitioners) who all work together to provide you with the care you need, when you need it.  We recommend signing up for the patient portal called "MyChart".  Sign up information is provided on this After Visit Summary.  MyChart is used to connect with patients for Virtual Visits (Telemedicine).  Patients are able to view lab/test results, encounter notes, upcoming appointments, etc.  Non-urgent messages can be sent to your provider as well.   To learn more about what you can do with MyChart, go to [email or web address].    Your next appointment:   1 year(s)  The format for your next appointment:   In Person  Provider:   Mark Skains, MD   Thank you for choosing [facility]!!    

## 2021-06-21 NOTE — Assessment & Plan Note (Signed)
2017, catheterization reviewed, spontaneous coronary artery dissection.  Low-level troponin 0.2.  Normal EF.  Continuing with aspirin 81 mg.  Currently on Crestor 10 mg a day.  Continue with goal-directed medical therapy.

## 2021-06-21 NOTE — Assessment & Plan Note (Signed)
Currently well controlled.  Previously 12.5 mg metoprolol was stopped.  Monitoring at home.

## 2021-06-21 NOTE — Assessment & Plan Note (Signed)
2017, diagonal branch.

## 2021-06-21 NOTE — Assessment & Plan Note (Signed)
Crestor 10 mg a day.  Previously LDL 53.  No myalgias.  Excellent.

## 2021-06-21 NOTE — Progress Notes (Signed)
Cardiology Office Note:    Date:  06/21/2021   ID:  Leslie Roberts, DOB [birth year], MRN [id number]  PCP:  Patient, No Pcp Per (Inactive)   [facility] Providers Cardiologist:  Donato Schultz, MD     Referring MD: No ref. provider found    History of Present Illness:    Leslie Roberts is a 61 y.o. female here for follow-up of CAD.  Non-STEMI 2017, scad.  Distal tapering of diagonal branch was suspicious for scad.  Medical therapy.  Interestingly, she states that her dissection occurred during the lunar eclipse.  Renal artery duplex showed no evidence of renal artery stenosis.  Echocardiogram in 2017 showed normal EF grade 1 diastolic dysfunction.  Mild aortic regurgitation.  Doing well no fevers chills nausea vomiting syncope bleeding.  Not interested in high-dose statin previously.  She is taking her Crestor 10 mg without any issues.  Still a bit hesitant about it but the benefits outweigh the risks.  Enjoys walking.  Went previously to the [employer].  Past Medical History:  Diagnosis Date   CAD (coronary artery disease)    a. 01/2016: NSTEMI with 90% stenosis along D1 suspicious for SCAD. Otherwise, normal cors.    Cat allergies    Diverticulosis 04/2011   Heart attack (HCC) 01/31/2016   HLD (hyperlipidemia)    HSV-1 infection    HTN (hypertension)    Toenail fungus     Past Surgical History:  Procedure Laterality Date   CARDIAC CATHETERIZATION N/A 02/01/2016   Procedure: Left Heart Cath and Coronary Angiography;  Surgeon: Yvonne Kendall, MD;  Location: [facility] INVASIVE CV LAB;  Service: Cardiovascular;  Laterality: N/A;   COLONOSCOPY  2013   ENDOMETRIAL BIOPSY  10/2009   benign   REDUCTION MAMMAPLASTY Bilateral 1986    Current Medications: Current Meds  Medication Sig   aspirin EC 81 MG EC tablet Take 1 tablet (81 mg total) by mouth daily.   B Complex Vitamins (VITAMIN B COMPLEX) CAPS Take by mouth daily.   Cetirizine HCl (ZYRTEC PO) Take 1 tablet by mouth as  needed.    fish oil-omega-3 fatty acids 1000 MG capsule Take 2 g by mouth daily.   Multiple Vitamin (MULTIVITAMIN) tablet Take 1 tablet by mouth daily.   Polyethyl Glycol-Propyl Glycol (SYSTANE FREE OP) Place 1 drop into both eyes daily as needed. For dry eyes   rosuvastatin (CRESTOR) 10 MG tablet Take 1 tablet (10 mg total) by mouth daily. Please keep upcoming appt in January 2023 with Dr. Anne Fu before anymore refills. Thank you Final Attempt   valACYclovir (VALTREX) 500 MG tablet Take 1 tablet (500 mg total) by mouth 2 (two) times daily. Take for 3 days as needed.   vitamin C (ASCORBIC ACID) 500 MG tablet Take 500 mg by mouth daily.   vitamin E 1000 UNIT capsule Take 1,000 Units by mouth daily.   zinc gluconate 50 MG tablet Take 50 mg by mouth daily.     Allergies:   Patient has no known allergies.   Social History   Socioeconomic History   Marital status: Married    Spouse name: Not on file   Number of children: Not on file   Years of education: Not on file   Highest education level: Not on file  Occupational History   Not on file  Tobacco Use   Smoking status: Never   Smokeless tobacco: Never  Vaping Use   Vaping Use: Never used  Substance and Sexual Activity   Alcohol use:  Yes    Alcohol/week: 3.0 - 5.0 standard drinks    Types: 3 - 5 Glasses of wine per week    Comment: 4-6 drinks a week (wine or beer)   Drug use: No   Sexual activity: Not Currently    Partners: Male    Birth control/protection: Post-menopausal  Other Topics Concern   Not on file  Social History Narrative   Not on file   Social Determinants of Health   Financial Resource Strain: Not on file  Food Insecurity: Not on file  Transportation Needs: Not on file  Physical Activity: Not on file  Stress: Not on file  Social Connections: Not on file     Family History: The patient's family history includes Cancer in her mother; Diabetes in her father; Heart disease in her father; Hypertension in her  father; Thyroid disease in her mother and sister. There is no history of Colon cancer (age of onset: 39), Esophageal cancer, Stomach cancer, or Breast cancer.  ROS:   Please see the history of present illness.     All other systems reviewed and are negative.  EKGs/Labs/Other Studies Reviewed:    The following studies were reviewed today:  Cardiac cath 02/01/2016 Conclusion      The left ventricular systolic function is normal. LV end diastolic pressure is normal. The left ventricular ejection fraction is 55-65% by visual estimate. Lat 1st Diag lesion, 90 %stenosed.   1. Angiographically normal RCA and left circumflex 2. Angiographically normal proximal LAD with distal tapering of the diagonal branch suspicious for spontaneous coronary artery dissection 3. Normal LV function with subtle apical hypokinesis   Medical therapy, echo, and carotid/renal artery duplex to evaluate for other vascular anomalies such as FMD.    Diagnostic Dominance: Right      Renal US 02/02/2016 Summary:   - No evidence of renal artery stenosis noted bilaterally. - Bilateral normal intrarenal resistive indices.     Carotid US 02/02/2016 Summary:   - No significant extracranial carotid artery stenosis demonstrated.   Vertebrals are patent with antegrade flow. - Elevated velocities noted at mid ICA bilaterally, but appears to   be due to tortuosity. Other etiology could be FMD.     Echo 02/02/2016 LV EF: 60% -   65%   ------------------------------------------------------------------- Indications:      Chest pain 786.51.   ------------------------------------------------------------------- History:   PMH:  No prior cardiac history.   ------------------------------------------------------------------- Study Conclusions   - Left ventricle: The cavity size was normal. Systolic function was   normal. The estimated ejection fraction was in the range of 60%   to 65%. Wall motion was normal; there  were no regional wall   motion abnormalities. Doppler parameters are consistent with   abnormal left ventricular relaxation (grade 1 diastolic   dysfunction). There was no evidence of elevated ventricular   filling pressure by Doppler parameters. - Aortic valve: There was mild regurgitation. - Aortic root: The aortic root was normal in size. - Ascending aorta: The ascending aorta was normal in size. - Mitral valve: Structurally normal valve. - Left atrium: The atrium was normal in size. - Right ventricle: The cavity size was normal. Wall thickness was   normal. Systolic function was normal. - Tricuspid valve: There was mild regurgitation. - Pulmonary arteries: Systolic pressure was within the normal   range. - Inferior vena cava: The vessel was normal in size. - Pericardium, extracardiac: There was no pericardial effusion.    EKG:  EKG is  ordered  today.  The ekg ordered today demonstrates sinus rhythm 64 no other changes  Recent Labs: 10/06/2020: ALT 26  Recent Lipid Panel    Component Value Date/Time   CHOL 134 10/06/2020 0744   TRIG 116 10/06/2020 0744   HDL 60 10/06/2020 0744   CHOLHDL 2.2 10/06/2020 0744   CHOLHDL 2.8 04/07/2016 0837   VLDL 23 04/07/2016 0837   LDLCALC 53 10/06/2020 0744     Risk Assessment/Calculations:              Physical Exam:    VS:  BP 130/90 (BP Location: Left Arm, Patient Position: Sitting, Cuff Size: Normal)    Pulse 64    Ht 5' 2.5" (1.588 m)    Wt 156 lb (70.8 kg)    LMP 06/12/2009    SpO2 98%    BMI 28.08 kg/m     Wt Readings from Last 3 Encounters:  06/21/21 156 lb (70.8 kg)  10/27/20 150 lb (68 kg)  01/08/20 152 lb 12.8 oz (69.3 kg)     GEN:  Well nourished, well developed in no acute distress HEENT: Normal NECK: No JVD; No carotid bruits LYMPHATICS: No lymphadenopathy CARDIAC: RRR, no murmurs, no rubs, gallops RESPIRATORY:  Clear to auscultation without rales, wheezing or rhonchi  ABDOMEN: Soft, non-tender,  non-distended MUSCULOSKELETAL:  No edema; No deformity  SKIN: Warm and dry NEUROLOGIC:  Alert and oriented x 3 PSYCHIATRIC:  Normal affect   ASSESSMENT:    1. Spontaneous dissection of coronary artery   2. History of myocardial infarction   3. Pure hypercholesterolemia   4. Benign essential HTN    PLAN:    In order of problems listed above:  History of myocardial infarction 2017, catheterization reviewed, spontaneous coronary artery dissection.  Low-level troponin 0.2.  Normal EF.  Continuing with aspirin 81 mg.  Currently on Crestor 10 mg a day.  Continue with goal-directed medical therapy.  HLD (hyperlipidemia) Crestor 10 mg a day.  Previously LDL 53.  No myalgias.  Excellent.  Benign essential HTN Currently well controlled.  Previously 12.5 mg metoprolol was stopped.  Monitoring at home.  Spontaneous dissection of coronary artery 2017, diagonal branch.    1-year follow-up   Medication Adjustments/Labs and Tests Ordered: Current medicines are reviewed at length with the patient today.  Concerns regarding medicines are outlined above.  Orders Placed This Encounter  Procedures   EKG 12-Lead   No orders of the defined types were placed in this encounter.   Patient Instructions  Medication Instructions:  The current medical regimen is effective;  continue present plan and medications.  *If you need a refill on your cardiac medications before your next appointment, please call your pharmacy*  Follow-Up: At [facility], you and your health needs are our priority.  As part of our continuing mission to provide you with exceptional heart care, we have created designated Provider Care Teams.  These Care Teams include your primary Cardiologist (physician) and Advanced Practice Providers (APPs -  Physician Assistants and Nurse Practitioners) who all work together to provide you with the care you need, when you need it.  We recommend signing up for the patient portal called  "MyChart".  Sign up information is provided on this After Visit Summary.  MyChart is used to connect with patients for Virtual Visits (Telemedicine).  Patients are able to view lab/test results, encounter notes, upcoming appointments, etc.  Non-urgent messages can be sent to your provider as well.   To learn more about what  you can do with MyChart, go to [email or web address].    Your next appointment:   1 year(s)  The format for your next appointment:   In Person  Provider:   Donato Schultz, MD     Thank you for choosing [facility]!!      Signed, Donato Schultz, MD  06/21/2021 9:54 AM    [facility]

## 2021-06-28 ENCOUNTER — Encounter: Payer: Self-pay | Admitting: Internal Medicine

## 2021-07-22 ENCOUNTER — Other Ambulatory Visit: Payer: Self-pay | Admitting: Obstetrics and Gynecology

## 2021-07-22 DIAGNOSIS — Z1231 Encounter for screening mammogram for malignant neoplasm of breast: Secondary | ICD-10-CM

## 2021-07-28 ENCOUNTER — Ambulatory Visit (AMBULATORY_SURGERY_CENTER): Payer: Commercial Managed Care - PPO | Admitting: *Deleted

## 2021-07-28 ENCOUNTER — Other Ambulatory Visit: Payer: Self-pay

## 2021-07-28 VITALS — Ht 63.0 in | Wt 155.0 lb

## 2021-07-28 DIAGNOSIS — Z1211 Encounter for screening for malignant neoplasm of colon: Secondary | ICD-10-CM

## 2021-07-28 MED ORDER — NA SULFATE-K SULFATE-MG SULF 17.5-3.13-1.6 GM/177ML PO SOLN: 1.0000 | Freq: Once | ORAL | 0 refills | Status: AC

## 2021-07-28 NOTE — Progress Notes (Signed)
Pt's previsit is done over the phone and all paperwork (prep instructions, blank consent form to just read over) sent to patient.  Pt's name and DOB verified at the beginning of the previsit.  Pt denies any difficulty with ambulating.     No trouble with anesthesia, denies being told they were difficult to intubate, or hx/fam hx of malignant hyperthermia per pt   No egg or soy allergy  No home oxygen use   No medications for weight loss taken  Pt denies constipation issues  Pt informed that we do not do prior authorizations for prep  

## 2021-07-29 ENCOUNTER — Other Ambulatory Visit: Payer: Self-pay | Admitting: *Deleted

## 2021-07-29 MED ORDER — ROSUVASTATIN CALCIUM 10 MG PO TABS: 10.0000 mg | ORAL_TABLET | Freq: Every day | ORAL | 3 refills | Status: DC

## 2021-08-09 ENCOUNTER — Encounter: Payer: Self-pay | Admitting: Internal Medicine

## 2021-08-11 ENCOUNTER — Encounter: Payer: Self-pay | Admitting: Internal Medicine

## 2021-08-11 ENCOUNTER — Ambulatory Visit (AMBULATORY_SURGERY_CENTER): Payer: Commercial Managed Care - PPO | Admitting: Internal Medicine

## 2021-08-11 ENCOUNTER — Other Ambulatory Visit: Payer: Self-pay

## 2021-08-11 VITALS — BP 117/73 | HR 60 | Temp 98.4°F | Resp 15 | Ht 63.0 in | Wt 155.0 lb

## 2021-08-11 DIAGNOSIS — Z1211 Encounter for screening for malignant neoplasm of colon: Secondary | ICD-10-CM | POA: Diagnosis present

## 2021-08-11 MED ORDER — SODIUM CHLORIDE 0.9 % IV SOLN: 500.0000 mL | Freq: Once | INTRAVENOUS | Status: DC

## 2021-08-11 NOTE — Op Note (Signed)
[facility] ?Patient Name: Leslie Roberts ?Procedure Date: 08/11/2021 7:16 AM ?MRN: [id number] ?Endoscopist: Beverley Fiedler , MD ?Age: 61 ?Referring MD:  ?Date of Birth: Oct 12, 1960 ?Gender: Female ?Account #: [id number] ?Procedure:                Colonoscopy ?Indications:              Screening for colorectal malignant neoplasm, Last  ?                          colonoscopy: 2012 ?Medicines:                Monitored Anesthesia Care ?Procedure:                Pre-Anesthesia Assessment: ?                          - Prior to the procedure, a History and Physical  ?                          was performed, and patient medications and  ?                          allergies were reviewed. The patient's tolerance of  ?                          previous anesthesia was also reviewed. The risks  ?                          and benefits of the procedure and the sedation  ?                          options and risks were discussed with the patient.  ?                          All questions were answered, and informed consent  ?                          was obtained. Prior Anticoagulants: The patient has  ?                          taken no previous anticoagulant or antiplatelet  ?                          agents. ASA Grade Assessment: III - A patient with  ?                          severe systemic disease. After reviewing the risks  ?                          and benefits, the patient was deemed in  ?                          satisfactory condition to undergo the procedure. ?  After obtaining informed consent, the colonoscope  ?                          was passed under direct vision. Throughout the  ?                          procedure, the patient's blood pressure, pulse, and  ?                          oxygen saturations were monitored continuously. The  ?                          Olympus PCF-H190DL (ON#[id number]) Colonoscope was  ?                          introduced through the anus and advanced  to the  ?                          cecum, identified by appendiceal orifice and  ?                          ileocecal valve. The colonoscopy was performed  ?                          without difficulty. The patient tolerated the  ?                          procedure well. The quality of the bowel  ?                          preparation was excellent. The ileocecal valve,  ?                          appendiceal orifice, and rectum were photographed. ?Scope In: 8:12:38 AM ?Scope Out: 8:22:11 AM ?Scope Withdrawal Time: 0 hours 7 minutes 13 seconds  ?Total Procedure Duration: 0 hours 9 minutes 33 seconds  ?Findings:                 The digital rectal exam was normal. ?                          Multiple small and large-mouthed diverticula were  ?                          found in the sigmoid colon and descending colon. ?                          The exam was otherwise without abnormality on  ?                          direct and retroflexion views. ?Complications:            No immediate complications. ?Estimated Blood Loss:     Estimated blood loss: none. ?Impression:               - Diverticulosis in  the sigmoid colon and in the  ?                          descending colon. ?                          - The examination was otherwise normal on direct  ?                          and retroflexion views. ?                          - No specimens collected. ?Recommendation:           - Patient has a contact number available for  ?                          emergencies. The signs and symptoms of potential  ?                          delayed complications were discussed with the  ?                          patient. Return to normal activities tomorrow.  ?                          Written discharge instructions were provided to the  ?                          patient. ?                          - Resume previous diet. ?                          - Continue present medications. ?                          - Repeat colonoscopy in 10  years for screening  ?                          purposes. ?Beverley Fiedler, MD ?08/11/2021 8:25:53 AM ?This report has been signed electronically. ?

## 2021-08-11 NOTE — Progress Notes (Signed)
Vs by [facility] ?

## 2021-08-11 NOTE — Progress Notes (Signed)
PT taken to PACU. Monitors in place. VSS. Report given to RN. 

## 2021-08-11 NOTE — Progress Notes (Signed)
? ?GASTROENTEROLOGY PROCEDURE H&P NOTE  ? ?Primary Care Physician: ?Patient, No Pcp Per (Inactive) ? ? ? ?Reason for Procedure:  Colorectal cancer screening. ? ?Plan:    Colonoscopy ? ?Patient is appropriate for endoscopic procedure(s) in the ambulatory ([facility]) setting. ? ?The nature of the procedure, as well as the risks, benefits, and alternatives were carefully and thoroughly reviewed with the patient. Ample time for discussion and questions allowed. The patient understood, was satisfied, and agreed to proceed.  ? ? ? ?HPI: ?Leslie Roberts is a 61 y.o. female who presents for colonoscopy.  Medical history as below.  Tolerated the prep.  No recent chest pain or shortness of breath.  No abdominal pain today. ? ?Past Medical History:  ?Diagnosis Date  ? Allergy   ? CAD (coronary artery disease)   ? a. 01/2016: NSTEMI with 90% stenosis along D1 suspicious for SCAD. Otherwise, normal cors.   ? Cat allergies   ? Diverticulosis 04/2011  ? Heart attack (HCC) 01/31/2016  ? HLD (hyperlipidemia)   ? HSV-1 infection   ? HTN (hypertension)   ? Toenail fungus   ? ? ?Past Surgical History:  ?Procedure Laterality Date  ? CARDIAC CATHETERIZATION N/A 02/01/2016  ? Procedure: Left Heart Cath and Coronary Angiography;  Surgeon: Yvonne Kendall, MD;  Location: [facility] INVASIVE CV LAB;  Service: Cardiovascular;  Laterality: N/A;  ? COLONOSCOPY  2013  ? ENDOMETRIAL BIOPSY  10/2009  ? benign  ? REDUCTION MAMMAPLASTY Bilateral 1986  ? ? ?Prior to Admission medications   ?Medication Sig Start Date End Date Taking? Authorizing Provider  ?B Complex Vitamins (VITAMIN B COMPLEX) CAPS Take by mouth daily.   Yes [provider]  ?Cetirizine HCl (ZYRTEC PO) Take 1 tablet by mouth as needed.    Yes [provider]  ?fish oil-omega-3 fatty acids 1000 MG capsule Take 2 g by mouth daily.   Yes [provider]  ?Multiple Vitamin (MULTIVITAMIN) tablet Take 1 tablet by mouth daily.   Yes [provider]  ?Polyethyl  Glycol-Propyl Glycol (SYSTANE FREE OP) Place 1 drop into both eyes daily as needed. For dry eyes   Yes [provider]  ?Quercetin 50 MG TABS Take by mouth.   Yes [provider]  ?rosuvastatin (CRESTOR) 10 MG tablet Take 1 tablet (10 mg total) by mouth daily. 07/29/21  Yes Jake Bathe, MD  ?vitamin C (ASCORBIC ACID) 500 MG tablet Take 500 mg by mouth daily.   Yes [provider]  ?vitamin E 1000 UNIT capsule Take 1,000 Units by mouth daily.   Yes [provider]  ?zinc gluconate 50 MG tablet Take 50 mg by mouth daily.   Yes [provider]  ?aspirin EC 81 MG EC tablet Take 1 tablet (81 mg total) by mouth daily. 02/02/16   [place] Ouch, Lennart Pall, PA-C  ?valACYclovir (VALTREX) 500 MG tablet Take 1 tablet (500 mg total) by mouth 2 (two) times daily. Take for 3 days as needed. 10/27/20   Patton Salles, MD  ? ? ?Current Outpatient Medications  ?Medication Sig Dispense Refill  ? B Complex Vitamins (VITAMIN B COMPLEX) CAPS Take by mouth daily.    ? Cetirizine HCl (ZYRTEC PO) Take 1 tablet by mouth as needed.     ? fish oil-omega-3 fatty acids 1000 MG capsule Take 2 g by mouth daily.    ? Multiple Vitamin (MULTIVITAMIN) tablet Take 1 tablet by mouth daily.    ? Polyethyl Glycol-Propyl Glycol (SYSTANE FREE OP) Place  1 drop into both eyes daily as needed. For dry eyes    ? Quercetin 50 MG TABS Take by mouth.    ? rosuvastatin (CRESTOR) 10 MG tablet Take 1 tablet (10 mg total) by mouth daily. 90 tablet 3  ? vitamin C (ASCORBIC ACID) 500 MG tablet Take 500 mg by mouth daily.    ? vitamin E 1000 UNIT capsule Take 1,000 Units by mouth daily.    ? zinc gluconate 50 MG tablet Take 50 mg by mouth daily.    ? aspirin EC 81 MG EC tablet Take 1 tablet (81 mg total) by mouth daily.    ? valACYclovir (VALTREX) 500 MG tablet Take 1 tablet (500 mg total) by mouth 2 (two) times daily. Take for 3 days as needed. 30 tablet 1  ? ?Current Facility-Administered Medications  ?Medication Dose  Route Frequency Provider Last Rate Last Admin  ? 0.9 %  sodium chloride infusion  500 mL Intravenous Once Leldon Steege, Carie Caddy, MD      ? ? ?Allergies as of 08/11/2021  ? (No Known Allergies)  ? ? ?Family History  ?Problem Relation Age of Onset  ? Cancer Mother   ?     unknown primary  ? Thyroid disease Mother   ?     hyperthyroid  ? Hypertension Father   ? Heart disease Father   ? Diabetes Father   ? Thyroid disease Sister   ?     hypothyroid  ? Colon cancer Neg Hx 79  ?     Dec cancer unknown origin  ? Esophageal cancer Neg Hx   ? Stomach cancer Neg Hx   ? Breast cancer Neg Hx   ? Rectal cancer Neg Hx   ? ? ?Social History  ? ?Socioeconomic History  ? Marital status: Married  ?  Spouse name: Not on file  ? Number of children: Not on file  ? Years of education: Not on file  ? Highest education level: Not on file  ?Occupational History  ? Not on file  ?Tobacco Use  ? Smoking status: Never  ? Smokeless tobacco: Never  ?Vaping Use  ? Vaping Use: Never used  ?Substance and Sexual Activity  ? Alcohol use: Yes  ?  Alcohol/week: 3.0 - 5.0 standard drinks  ?  Types: 3 - 5 Glasses of wine per week  ?  Comment: 4-6 drinks a week (wine or beer)  ? Drug use: No  ? Sexual activity: Not Currently  ?  Partners: Male  ?  Birth control/protection: Post-menopausal  ?Other Topics Concern  ? Not on file  ?Social History Narrative  ? Not on file  ? ?Social Determinants of Health  ? ?Financial Resource Strain: Not on file  ?Food Insecurity: Not on file  ?Transportation Needs: Not on file  ?Physical Activity: Not on file  ?Stress: Not on file  ?Social Connections: Not on file  ?Intimate Partner Violence: Not on file  ? ? ?Physical Exam: ?Vital signs in last 24 hours: ?@BP  125/75   Pulse 79   Temp 98.4 ?F (36.9 ?C)   Ht 5\' 3"  (1.6 m)   Wt 155 lb (70.3 kg)   LMP 06/12/2009   SpO2 96%   BMI 27.46 kg/m?  ?GEN: NAD ?EYE: Sclerae anicteric ?ENT: MMM ?CV: Non-tachycardic ?Pulm: CTA b/l ?GI: Soft, NT/ND ?NEURO:  Alert & Oriented x 3 ? ? ? , MD ?[facility] ? ?08/11/2021 8:06 AM ? ?

## 2021-08-11 NOTE — Progress Notes (Signed)
Pt's states no medical or surgical changes since previsit or office visit. 

## 2021-08-11 NOTE — Patient Instructions (Signed)
Handout for diverticulosis given. ? ?YOU HAD AN ENDOSCOPIC PROCEDURE TODAY AT THE [facility]:   Refer to the procedure report that was given to you for any specific questions about what was found during the examination.  If the procedure report does not answer your questions, please call your gastroenterologist to clarify.  If you requested that your care partner not be given the details of your procedure findings, then the procedure report has been included in a sealed envelope for you to review at your convenience later. ? ?YOU SHOULD EXPECT: Some feelings of bloating in the abdomen. Passage of more gas than usual.  Walking can help get rid of the air that was put into your GI tract during the procedure and reduce the bloating. If you had a lower endoscopy (such as a colonoscopy or flexible sigmoidoscopy) you may notice spotting of blood in your stool or on the toilet paper. If you underwent a bowel prep for your procedure, you may not have a normal bowel movement for a few days. ? ?Please Note:  You might notice some irritation and congestion in your nose or some drainage.  This is from the oxygen used during your procedure.  There is no need for concern and it should clear up in a day or so. ? ?SYMPTOMS TO REPORT IMMEDIATELY: ? ?Following lower endoscopy (colonoscopy or flexible sigmoidoscopy): ? Excessive amounts of blood in the stool ? Significant tenderness or worsening of abdominal pains ? Swelling of the abdomen that is new, acute ? Fever of 100?F or higher ? ?For urgent or emergent issues, a gastroenterologist can be reached at any hour by calling [phone]. ?Do not use MyChart messaging for urgent concerns.  ? ? ?DIET:  We do recommend a small meal at first, but then you may proceed to your regular diet.  Drink plenty of fluids but you should avoid alcoholic beverages for 24 hours. ? ?ACTIVITY:  You should plan to take it easy for the rest of today and you should NOT DRIVE or use heavy  machinery until tomorrow (because of the sedation medicines used during the test).   ? ?FOLLOW UP: ?Our staff will call the number listed on your records 48-72 hours following your procedure to check on you and address any questions or concerns that you may have regarding the information given to you following your procedure. If we do not reach you, we will leave a message.  We will attempt to reach you two times.  During this call, we will ask if you have developed any symptoms of COVID 19. If you develop any symptoms (ie: fever, flu-like symptoms, shortness of breath, cough etc.) before then, please call [phone].  If you test positive for Covid 19 in the 2 weeks post procedure, please call and report this information to [place].   ? ?If any biopsies were taken you will be contacted by phone or by letter within the next 1-3 weeks.  Please call us at [phone] if you have not heard about the biopsies in 3 weeks.  ? ? ?SIGNATURES/CONFIDENTIALITY: ?You and/or your care partner have signed paperwork which will be entered into your electronic medical record.  These signatures attest to the fact that that the information above on your After Visit Summary has been reviewed and is understood.  Full responsibility of the confidentiality of this discharge information lies with you and/or your care-partner.  ?

## 2021-08-15 ENCOUNTER — Telehealth: Payer: Self-pay | Admitting: *Deleted

## 2021-08-15 NOTE — Telephone Encounter (Signed)
No answer on first attempt follow up call. Unable to leave message  ? ?

## 2021-08-15 NOTE — Telephone Encounter (Signed)
Second attempt, left VM.  

## 2021-09-05 ENCOUNTER — Ambulatory Visit
Admission: RE | Admit: 2021-09-05 | Discharge: 2021-09-05 | Disposition: A | Discharge status: Home / Self Care | Payer: Commercial Managed Care - PPO | Source: Ambulatory Visit | Attending: Obstetrics and Gynecology | Admitting: Obstetrics and Gynecology

## 2021-09-05 DIAGNOSIS — Z1231 Encounter for screening mammogram for malignant neoplasm of breast: Secondary | ICD-10-CM

## 2021-12-29 ENCOUNTER — Ambulatory Visit (INDEPENDENT_AMBULATORY_CARE_PROVIDER_SITE_OTHER): Payer: Commercial Managed Care - PPO | Admitting: Family Medicine

## 2021-12-29 ENCOUNTER — Encounter: Payer: Self-pay | Admitting: Family Medicine

## 2021-12-29 VITALS — BP 122/80 | HR 87 | Temp 97.6°F | Ht 62.5 in | Wt 161.5 lb

## 2021-12-29 DIAGNOSIS — Z0000 Encounter for general adult medical examination without abnormal findings: Secondary | ICD-10-CM | POA: Diagnosis not present

## 2021-12-29 LAB — CBC WITH DIFFERENTIAL/PLATELET
Basophils Absolute: 0 10*3/uL (ref 0.0–0.1)
Basophils Relative: 0.7 % (ref 0.0–3.0)
Eosinophils Absolute: 0.2 10*3/uL (ref 0.0–0.7)
Eosinophils Relative: 5.3 % — ABNORMAL HIGH (ref 0.0–5.0)
HCT: 40.9 % (ref 36.0–46.0)
Hemoglobin: 13.6 g/dL (ref 12.0–15.0)
Lymphocytes Relative: 31.7 % (ref 12.0–46.0)
Lymphs Abs: 1.4 10*3/uL (ref 0.7–4.0)
MCHC: 33.2 g/dL (ref 30.0–36.0)
MCV: 96.2 fl (ref 78.0–100.0)
Monocytes Absolute: 0.3 10*3/uL (ref 0.1–1.0)
Monocytes Relative: 6.3 % (ref 3.0–12.0)
Neutro Abs: 2.5 10*3/uL (ref 1.4–7.7)
Neutrophils Relative %: 56 % (ref 43.0–77.0)
Platelets: 230 10*3/uL (ref 150.0–400.0)
RBC: 4.25 Mil/uL (ref 3.87–5.11)
RDW: 13 % (ref 11.5–15.5)
WBC: 4.5 10*3/uL (ref 4.0–10.5)

## 2021-12-29 LAB — LIPID PANEL
Cholesterol: 137 mg/dL (ref 0–200)
HDL: 56.1 mg/dL (ref >39.00)
LDL Cholesterol: 59 mg/dL (ref 0–99)
NonHDL: 81.35
Total CHOL/HDL Ratio: 2
Triglycerides: 113 mg/dL (ref 0.0–149.0)
VLDL: 22.6 mg/dL (ref 0.0–40.0)

## 2021-12-29 LAB — COMPREHENSIVE METABOLIC PANEL
ALT: 19 U/L (ref 0–35)
AST: 24 U/L (ref 0–37)
Albumin: 4.3 g/dL (ref 3.5–5.2)
Alkaline Phosphatase: 74 U/L (ref 39–117)
BUN: 10 mg/dL (ref 6–23)
CO2: 28 mEq/L (ref 19–32)
Calcium: 9.6 mg/dL (ref 8.4–10.5)
Chloride: 106 mEq/L (ref 96–112)
Creatinine, Ser: 0.69 mg/dL (ref 0.40–1.20)
GFR: 93.5 mL/min (ref >60.00)
Glucose, Bld: 102 mg/dL — ABNORMAL HIGH (ref 70–99)
Potassium: 4.5 mEq/L (ref 3.5–5.1)
Sodium: 143 mEq/L (ref 135–145)
Total Bilirubin: 0.5 mg/dL (ref 0.2–1.2)
Total Protein: 7.2 g/dL (ref 6.0–8.3)

## 2021-12-29 LAB — HEMOGLOBIN A1C: Hgb A1c MFr Bld: 5.8 % (ref 4.6–6.5)

## 2021-12-29 LAB — TSH: TSH: 3.8 u[IU]/mL (ref 0.35–5.50)

## 2021-12-29 NOTE — Patient Instructions (Signed)
Welcome to [facility] at [place]! It was a pleasure meeting you today. ° °As discussed, Please schedule a 12 month follow up visit today. ° °PLEASE NOTE: ° °If you had any LAB tests please let us know if you have not heard back within a few days. You may see your results on MyChart before we have a chance to review them but we will give you a call once they are reviewed by us. If we ordered any REFERRALS today, please let us know if you have not heard from their office within the next week.  °Let us know through MyChart if you are needing REFILLS, or have your pharmacy send us the request. You can also use MyChart to communicate with me or any office staff. ° °Please try these tips to maintain a healthy lifestyle: ° °Eat most of your calories during the day when you are active. Eliminate processed foods including packaged sweets (pies, cakes, cookies), reduce intake of potatoes, white bread, white pasta, and white rice. Look for whole grain options, oat flour or almond flour. ° °Each meal should contain half fruits/vegetables, one quarter protein, and one quarter carbs (no bigger than a computer mouse). ° °Cut down on sweet beverages. This includes juice, soda, and sweet tea. Also watch fruit intake, though this is a healthier sweet option, it still contains natural sugar! Limit to 3 servings daily. ° °Drink at least 1 glass of water with each meal and aim for at least 8 glasses per day ° °Exercise at least 150 minutes every week.   °

## 2021-12-29 NOTE — Progress Notes (Signed)
Phone [phone]   Subjective:   Patient is a 61 y.o. female presenting for annual physical.    Chief Complaint  Patient presents with   Establish Care    Need new PCP Fasting Upcoming pap in August  New pt-cpx.  Pap in Aug.  Walks daily.   See problem oriented charting- ROS-  ROS: Gen: no fever, chills  Skin: no rash, itching ENT: no ear pain, ear drainage, nasal congestion, rhinorrhea, sinus pressure, sore throat Eyes: no blurry vision, double vision Resp: no cough, wheeze,SOB CV: no CP, palpitations, LE edema,  GI: no heartburn, n/v/d/c, abd pain GU: no dysuria, urgency, frequency, hematuria MSK: no joint pain, myalgias, back pain Neuro: no dizziness, headache, weakness, vertigo Psych: no depression, anxiety, insomnia, SI   The following were reviewed and entered/updated in epic: Past Medical History:  Diagnosis Date   Allergy    CAD (coronary artery disease)    a. 01/2016: NSTEMI with 90% stenosis along D1 suspicious for SCAD. Otherwise, normal cors.    Cat allergies    Diverticulosis 04/2011   Heart attack (HCC) 01/31/2016   HLD (hyperlipidemia)    HSV-1 infection    HTN (hypertension)    Toenail fungus    Patient Active Problem List   Diagnosis Date Noted   History of myocardial infarction 06/21/2021   Spontaneous dissection of coronary artery 06/21/2021   HLD (hyperlipidemia) 02/02/2016   Benign essential HTN 02/02/2016   Nausea 02/02/2016   Past Surgical History:  Procedure Laterality Date   CARDIAC CATHETERIZATION N/A 02/01/2016   Procedure: Left Heart Cath and Coronary Angiography;  Surgeon: Yvonne Kendall, MD;  Location: [facility] INVASIVE CV LAB;  Service: Cardiovascular;  Laterality: N/A;   COLONOSCOPY  2013   ENDOMETRIAL BIOPSY  10/2009   benign   REDUCTION MAMMAPLASTY Bilateral 1986    Family History  Problem Relation Age of Onset   Cancer Mother        unknown primary   Thyroid disease Mother        hyperthyroid   Hypertension Father     Heart disease Father    Diabetes Father    Heart attack Father    Thyroid disease Sister        hypothyroid   Hyperlipidemia Brother    Heart disease Paternal Grandfather    Colon cancer Neg Hx 30       Dec cancer unknown origin   Esophageal cancer Neg Hx    Stomach cancer Neg Hx    Breast cancer Neg Hx    Rectal cancer Neg Hx     Medications- reviewed and updated Current Outpatient Medications  Medication Sig Dispense Refill   aspirin EC 81 MG EC tablet Take 1 tablet (81 mg total) by mouth daily.     B Complex Vitamins (VITAMIN B COMPLEX) CAPS Take by mouth daily.     Cetirizine HCl (ZYRTEC PO) Take 1 tablet by mouth as needed.      fish oil-omega-3 fatty acids 1000 MG capsule Take 2 g by mouth daily.     Multiple Vitamin (MULTIVITAMIN) tablet Take 1 tablet by mouth daily.     Polyethyl Glycol-Propyl Glycol (SYSTANE FREE OP) Place 1 drop into both eyes daily as needed. For dry eyes     Quercetin 50 MG TABS Take by mouth.     rosuvastatin (CRESTOR) 10 MG tablet Take 1 tablet (10 mg total) by mouth daily. 90 tablet 3   valACYclovir (VALTREX) 500 MG tablet Take 1 tablet (500  mg total) by mouth 2 (two) times daily. Take for 3 days as needed. 30 tablet 1   vitamin C (ASCORBIC ACID) 500 MG tablet Take 500 mg by mouth daily.     vitamin E 1000 UNIT capsule Take 1,000 Units by mouth daily.     zinc gluconate 50 MG tablet Take 50 mg by mouth daily.     No current facility-administered medications for this visit.    Allergies-reviewed and updated No Known Allergies  Social History   Social History Narrative   Computer planning   Objective  Objective:  BP 122/80   Pulse 87   Temp 97.6 F (36.4 C) (Temporal)   Ht 5' 2.5" (1.588 m)   Wt 161 lb 8 oz (73.3 kg)   LMP 06/12/2009   SpO2 96%   BMI 29.07 kg/m  Physical Exam Constitutional:      Appearance: Normal appearance.  HENT:     Head: Normocephalic and atraumatic.     Right Ear: Tympanic membrane, ear canal and  external ear normal.     Left Ear: Tympanic membrane, ear canal and external ear normal.     Nose: Nose normal.     Mouth/Throat:     Mouth: Mucous membranes are moist.     Pharynx: Oropharynx is clear.  Eyes:     General: No scleral icterus.    Extraocular Movements: Extraocular movements intact.     Conjunctiva/sclera: Conjunctivae normal.     Pupils: Pupils are equal, round, and reactive to light.  Neck:     Vascular: No carotid bruit.  Cardiovascular:     Rate and Rhythm: Normal rate and regular rhythm.     Pulses: Normal pulses.     Heart sounds: Normal heart sounds. No murmur heard. Pulmonary:     Effort: Pulmonary effort is normal.     Breath sounds: Normal breath sounds.  Abdominal:     General: Bowel sounds are normal. There is no distension.     Palpations: Abdomen is soft. There is no mass.     Tenderness: There is no abdominal tenderness.  Musculoskeletal:     Cervical back: Neck supple.     Right lower leg: No edema.     Left lower leg: No edema.  Lymphadenopathy:     Cervical: No cervical adenopathy.  Neurological:     General: No focal deficit present.     Mental Status: She is alert and oriented to person, place, and time.  Psychiatric:        Mood and Affect: Mood normal.        Assessment and Plan   Health Maintenance counseling: 1. Anticipatory guidance: Patient counseled regarding regular dental exams q6 months, eye exams,  avoiding smoking and second hand smoke, limiting alcohol to 1 beverage per day, no illicit drugs.   2. Risk factor reduction:  Advised patient of need for regular exercise and diet rich and fruits and vegetables to reduce risk of heart attack and stroke. Exercise- +.  Wt Readings from Last 3 Encounters:  12/29/21 161 lb 8 oz (73.3 kg)  08/11/21 155 lb (70.3 kg)  07/28/21 155 lb (70.3 kg)   3. Immunizations/screenings/ancillary studies Immunization History  Administered Date(s) Administered   Tdap 02/09/2014   Health  Maintenance Due  Topic Date Due   PAP SMEAR-Modifier  08/11/2021    4. Cervical cancer screening- sch for aug 5. Breast cancer screening-  mammogram feb 6. Colon cancer screening - utd 7. Skin cancer screening-  advised regular sunscreen use. Denies worrisome, changing, or new skin lesions.  8. Birth control/STD check- na 9. Osteoporosis screening- n/a 10. Smoking associated screening - non smoker  Problem List Items Addressed This Visit   None Visit Diagnoses     Wellness examination    -  Primary   Relevant Orders   Hemoglobin A1c   Lipid panel   TSH   Comprehensive metabolic panel   CBC with Differential/Platelet      [place] guidance.  Getting pap next mo.  RHM UTD.  Declines shingrix.  Check cbc,cmp,tsh,lipids, A1C.  Cont TLC.  F/u 1 yr  Recommended follow up: annual Return in about 1 year (around 12/30/2022) for annual. Future Appointments  Date Time Provider Department Center  02/01/2022  3:30 PM Patton Salles, MD [facility] None    Lab/Order associations:+ fasting   ICD-10-CM   1. Wellness examination  Z00.00 Hemoglobin A1c    Lipid panel    TSH    Comprehensive metabolic panel    CBC with Differential/Platelet      No orders of the defined types were placed in this encounter.   Angelena Sole, MD

## 2022-01-31 NOTE — Progress Notes (Signed)
61 y.o. G8P0020 Single Caucasian female here for annual exam.    Patient reporting vaginal dryness.  Traveling to [facility].   PCP:   Jeani Sow, MD  Patient's last menstrual period was 06/12/2009.           Sexually active: No.  The current method of family planning is post menopausal status.    Exercising: Yes.     Walks daily Smoker:  no  Health Maintenance: Pap:  08-12-18 Neg:Neg HR HPV, 05-11-16 Neg:Neg HR HPV, 01-17-13 Neg:Neg HR HPV History of abnormal Pap:  no MMG:  09-05-21 Neg/BiRads1 Colonoscopy:  08-11-21 Normal;10 years BMD:   n/a  Result  n/a TDaP:  02-09-14 Gardasil:   no HIV:08-07-17 NR Hep C:08-07-17 Neg Screening Labs:  PCP and cardiology.   reports that she has never smoked. She has never used smokeless tobacco. She reports current alcohol use of about 6.0 standard drinks of alcohol per week. She reports that she does not use drugs.  Past Medical History:  Diagnosis Date   Allergy    CAD (coronary artery disease)    a. 01/2016: NSTEMI with 90% stenosis along D1 suspicious for SCAD. Otherwise, normal cors.    Cat allergies    Diverticulosis 04/2011   Heart attack (HCC) 01/31/2016   HLD (hyperlipidemia)    HSV-1 infection    HTN (hypertension)    Toenail fungus     Past Surgical History:  Procedure Laterality Date   CARDIAC CATHETERIZATION N/A 02/01/2016   Procedure: Left Heart Cath and Coronary Angiography;  Surgeon: Yvonne Kendall, MD;  Location: [facility] INVASIVE CV LAB;  Service: Cardiovascular;  Laterality: N/A;   COLONOSCOPY  2013   ENDOMETRIAL BIOPSY  10/2009   benign   REDUCTION MAMMAPLASTY Bilateral 1986    Current Outpatient Medications  Medication Sig Dispense Refill   aspirin EC 81 MG EC tablet Take 1 tablet (81 mg total) by mouth daily.     B Complex Vitamins (VITAMIN B COMPLEX) CAPS Take by mouth daily.     Cetirizine HCl (ZYRTEC PO) Take 1 tablet by mouth as needed.      fish oil-omega-3 fatty acids 1000 MG capsule Take 2 g by mouth  daily.     Multiple Vitamin (MULTIVITAMIN) tablet Take 1 tablet by mouth daily.     Polyethyl Glycol-Propyl Glycol (SYSTANE FREE OP) Place 1 drop into both eyes daily as needed. For dry eyes     Quercetin 50 MG TABS Take by mouth.     rosuvastatin (CRESTOR) 10 MG tablet Take 1 tablet (10 mg total) by mouth daily. 90 tablet 3   valACYclovir (VALTREX) 500 MG tablet Take 1 tablet (500 mg total) by mouth 2 (two) times daily. Take for 3 days as needed. 30 tablet 1   vitamin C (ASCORBIC ACID) 500 MG tablet Take 500 mg by mouth daily.     vitamin E 1000 UNIT capsule Take 1,000 Units by mouth daily.     zinc gluconate 50 MG tablet Take 50 mg by mouth daily.     No current facility-administered medications for this visit.    Family History  Problem Relation Age of Onset   Cancer Mother        unknown primary   Thyroid disease Mother        hyperthyroid   Hypertension Father    Heart disease Father    Diabetes Father    Heart attack Father    Thyroid disease Sister  hypothyroid   Hyperlipidemia Brother    Heart disease Paternal Grandfather    Colon cancer Neg Hx 79       Dec cancer unknown origin   Esophageal cancer Neg Hx    Stomach cancer Neg Hx    Breast cancer Neg Hx    Rectal cancer Neg Hx     Review of Systems  Genitourinary:  Positive for vaginal pain (vaginal dryness).  All other systems reviewed and are negative.   Exam:   BP 122/78   Pulse 65   Ht 5\' 2"  (1.575 m)   Wt 158 lb (71.7 kg)   LMP 06/12/2009   SpO2 99%   BMI 28.90 kg/m     General appearance: alert, cooperative and appears stated age Head: normocephalic, without obvious abnormality, atraumatic Neck: no adenopathy, supple, symmetrical, trachea midline and thyroid normal to inspection and palpation Lungs: clear to auscultation bilaterally Breasts: consistent with reduction, no masses or tenderness, No nipple retraction or dimpling, No nipple discharge or bleeding, No axillary adenopathy Heart:  regular rate and rhythm Abdomen: soft, non-tender; no masses, no organomegaly Extremities: extremities normal, atraumatic, no cyanosis or edema Skin: skin color, texture, turgor normal. No rashes or lesions Lymph nodes: cervical, supraclavicular, and axillary nodes normal. Neurologic: grossly normal  Pelvic: External genitalia:  no lesions              No abnormal inguinal nodes palpated.              Urethra:  normal appearing urethra with no masses, tenderness or lesions              Bartholins and Skenes: normal                 Vagina: normal appearing vagina with normal color and discharge, no lesions. Atrophy noted.               Cervix: no lesions              Pap taken: no Bimanual Exam:  Uterus:  normal size, contour, position, consistency, mobility, non-tender              Adnexa: no mass, fullness, tenderness              Rectal exam: Yes.  .  Confirms.              Anus:  normal sphincter tone, no lesions  Chaperone was present for exam:  08/10/2009, CMA  Assessment:   Well woman visit with gynecologic exam. Vaginal atrophy.  HSV I.  Genital. Hx bilateral breast reduction.  Hx MI.  Plan: Mammogram screening discussed. Self breast awareness reviewed. Pap and HR HPV 2025. Guidelines for Calcium, Vitamin D, regular exercise program including cardiovascular and weight bearing exercise. Refill of Valtrex.  We discussed water based lubricants, cooking oils, vit E for vaginal hydration.  Follow up annually and prn.   After visit summary provided.

## 2022-02-01 ENCOUNTER — Ambulatory Visit (INDEPENDENT_AMBULATORY_CARE_PROVIDER_SITE_OTHER): Payer: Commercial Managed Care - PPO | Admitting: Obstetrics and Gynecology

## 2022-02-01 ENCOUNTER — Encounter: Payer: Self-pay | Admitting: Obstetrics and Gynecology

## 2022-02-01 VITALS — BP 122/78 | HR 65 | Ht 62.0 in | Wt 158.0 lb

## 2022-02-01 DIAGNOSIS — Z01419 Encounter for gynecological examination (general) (routine) without abnormal findings: Secondary | ICD-10-CM

## 2022-02-01 MED ORDER — VALACYCLOVIR HCL 500 MG PO TABS: 500.0000 mg | ORAL_TABLET | Freq: Two times a day (BID) | ORAL | 1 refills | Status: DC

## 2022-02-01 NOTE — Patient Instructions (Signed)

## 2022-03-06 ENCOUNTER — Encounter: Payer: Self-pay | Admitting: *Deleted

## 2022-05-25 ENCOUNTER — Encounter: Payer: Self-pay | Admitting: *Deleted

## 2022-06-23 ENCOUNTER — Encounter: Payer: Self-pay | Admitting: Family Medicine

## 2022-06-28 ENCOUNTER — Encounter: Payer: Self-pay | Admitting: Family Medicine

## 2022-06-28 ENCOUNTER — Ambulatory Visit (INDEPENDENT_AMBULATORY_CARE_PROVIDER_SITE_OTHER): Payer: Commercial Managed Care - PPO | Admitting: Family Medicine

## 2022-06-28 VITALS — BP 130/80 | HR 64 | Temp 97.5°F | Ht 62.5 in | Wt 166.1 lb

## 2022-06-28 DIAGNOSIS — J40 Bronchitis, not specified as acute or chronic: Secondary | ICD-10-CM

## 2022-06-28 MED ORDER — AZITHROMYCIN 250 MG PO TABS: ORAL_TABLET | ORAL | 0 refills | Status: AC

## 2022-06-28 MED ORDER — PREDNISONE 20 MG PO TABS: 40.0000 mg | ORAL_TABLET | Freq: Every day | ORAL | 0 refills | Status: AC

## 2022-06-28 MED ORDER — BENZONATATE 100 MG PO CAPS: 100.0000 mg | ORAL_CAPSULE | Freq: Three times a day (TID) | ORAL | 0 refills | Status: DC | PRN

## 2022-06-28 NOTE — Patient Instructions (Signed)
Can stop prednisone early if want.   But finish zpack

## 2022-06-28 NOTE — Progress Notes (Signed)
Subjective:     Patient ID: Leslie Roberts, female    DOB: 09-Jul-1960, 62 y.o.   MRN: [id number]  Chief Complaint  Patient presents with   Cough    Ongoing cough x 3-4 weeks, productive at times    HPI Cough for 3-4 wks. Had bad cold in Dec and resolved in 10 days, but then cough started.  "Fits" of coughing.  No sob.  Tired in afternoon.  No f/c. No v/d.  No congestion.  Just lingering cough. No h/o asthma.    Health Maintenance Due  Topic Date Due   PAP SMEAR-Modifier  08/11/2021    Past Medical History:  Diagnosis Date   Allergy    CAD (coronary artery disease)    a. 01/2016: NSTEMI with 90% stenosis along D1 suspicious for SCAD. Otherwise, normal cors.    Cat allergies    Diverticulosis 04/2011   Heart attack ([place]) 01/31/2016   HLD (hyperlipidemia)    HSV-1 infection    HTN (hypertension)    Toenail fungus     Past Surgical History:  Procedure Laterality Date   CARDIAC CATHETERIZATION N/A 02/01/2016   Procedure: Left Heart Cath and Coronary Angiography;  Surgeon: Nelva Bush, MD;  Location: [place] CV LAB;  Service: Cardiovascular;  Laterality: N/A;   COLONOSCOPY  2013   ENDOMETRIAL BIOPSY  10/2009   benign   REDUCTION MAMMAPLASTY Bilateral 1986    Outpatient Medications Prior to Visit  Medication Sig Dispense Refill   aspirin EC 81 MG EC tablet Take 1 tablet (81 mg total) by mouth daily.     B Complex Vitamins (VITAMIN B COMPLEX) CAPS Take by mouth daily.     Cetirizine HCl (ZYRTEC PO) Take 1 tablet by mouth as needed.      fish oil-omega-3 fatty acids 1000 MG capsule Take 2 g by mouth daily.     Multiple Vitamin (MULTIVITAMIN) tablet Take 1 tablet by mouth daily.     Polyethyl Glycol-Propyl Glycol (SYSTANE FREE OP) Place 1 drop into both eyes daily as needed. For dry eyes     Quercetin 50 MG TABS Take by mouth.     rosuvastatin (CRESTOR) 10 MG tablet Take 1 tablet (10 mg total) by mouth daily. 90 tablet 3   valACYclovir (VALTREX) 500 MG tablet  Take 1 tablet (500 mg total) by mouth 2 (two) times daily. Take for 3 days as needed. 30 tablet 1   vitamin C (ASCORBIC ACID) 500 MG tablet Take 500 mg by mouth daily.     vitamin E 1000 UNIT capsule Take 1,000 Units by mouth daily.     zinc gluconate 50 MG tablet Take 50 mg by mouth daily.     No facility-administered medications prior to visit.    No Known Allergies ROS neg/noncontributory except as noted HPI/below      Objective:     BP 130/80   Pulse 64   Temp (!) 97.5 F (36.4 C) (Temporal)   Ht 5' 2.5" (1.588 m)   Wt 166 lb 2 oz (75.4 kg)   LMP 06/12/2009   SpO2 98%   BMI 29.90 kg/m  Wt Readings from Last 3 Encounters:  06/28/22 166 lb 2 oz (75.4 kg)  02/01/22 158 lb (71.7 kg)  12/29/21 161 lb 8 oz (73.3 kg)    Physical Exam   Gen: WDWN NAD HEENT: NCAT, conjunctiva not injected, sclera nonicteric TM WNL B, OP moist, no exudates  NECK:  supple, no thyromegaly, no nodes, no carotid  bruits CARDIAC: RRR, S1S2+, no murmur.  LUNGS: CTAB. Some exp rhonchi on L EXT:  no edema MSK: no gross abnormalities.  NEURO: A&O x3.  CN II-XII intact.  PSYCH: normal mood. Good eye contact     Assessment & Plan:   Problem List Items Addressed This Visit   None Visit Diagnoses     Bronchitis    -  Primary      Bronchitis-acute.  Zpk, pred 40mg  daily, tessalon perles 100mg  tid prn.  No orders of the defined types were placed in this encounter.   Wellington Hampshire, MD

## 2022-09-07 ENCOUNTER — Other Ambulatory Visit (HOSPITAL_BASED_OUTPATIENT_CLINIC_OR_DEPARTMENT_OTHER): Payer: Self-pay | Admitting: Family Medicine

## 2022-09-07 DIAGNOSIS — Z1231 Encounter for screening mammogram for malignant neoplasm of breast: Secondary | ICD-10-CM

## 2022-09-20 ENCOUNTER — Encounter (HOSPITAL_BASED_OUTPATIENT_CLINIC_OR_DEPARTMENT_OTHER): Payer: Commercial Managed Care - PPO | Admitting: Radiology

## 2022-09-20 DIAGNOSIS — Z1231 Encounter for screening mammogram for malignant neoplasm of breast: Secondary | ICD-10-CM

## 2022-09-22 ENCOUNTER — Ambulatory Visit (HOSPITAL_BASED_OUTPATIENT_CLINIC_OR_DEPARTMENT_OTHER)
Admission: RE | Admit: 2022-09-22 | Discharge: 2022-09-22 | Disposition: A | Discharge status: Home / Self Care | Payer: Commercial Managed Care - PPO | Source: Ambulatory Visit | Attending: Family Medicine | Admitting: Family Medicine

## 2022-09-22 DIAGNOSIS — Z1231 Encounter for screening mammogram for malignant neoplasm of breast: Secondary | ICD-10-CM

## 2022-10-19 ENCOUNTER — Encounter: Payer: Self-pay | Admitting: Family Medicine

## 2022-10-20 ENCOUNTER — Other Ambulatory Visit: Payer: Self-pay | Admitting: Family Medicine

## 2022-10-20 MED ORDER — LORAZEPAM 0.5 MG PO TABS: 0.5000 mg | ORAL_TABLET | Freq: Three times a day (TID) | ORAL | 0 refills | Status: AC | PRN

## 2022-10-24 ENCOUNTER — Telehealth: Payer: Commercial Managed Care - PPO | Admitting: Family Medicine

## 2022-10-24 NOTE — Telephone Encounter (Signed)
Patient states she is not going to continue taking Lorazepam-it makes her too sleepy

## 2023-11-21 ENCOUNTER — Encounter: Payer: Self-pay | Admitting: Family Medicine

## 2023-11-21 ENCOUNTER — Ambulatory Visit (INDEPENDENT_AMBULATORY_CARE_PROVIDER_SITE_OTHER): Payer: Commercial Managed Care - PPO | Admitting: Family Medicine

## 2023-11-21 VITALS — BP 126/96 | HR 78 | Temp 98.0°F | Resp 16 | Ht 62.5 in | Wt 161.1 lb

## 2023-11-21 DIAGNOSIS — E7800 Pure hypercholesterolemia, unspecified: Secondary | ICD-10-CM | POA: Diagnosis not present

## 2023-11-21 DIAGNOSIS — I252 Old myocardial infarction: Secondary | ICD-10-CM

## 2023-11-21 DIAGNOSIS — Z0000 Encounter for general adult medical examination without abnormal findings: Secondary | ICD-10-CM

## 2023-11-21 DIAGNOSIS — R030 Elevated blood-pressure reading, without diagnosis of hypertension: Secondary | ICD-10-CM

## 2023-11-21 MED ORDER — ROSUVASTATIN CALCIUM 10 MG PO TABS: 10.0000 mg | ORAL_TABLET | Freq: Every day | ORAL | 0 refills | Status: AC

## 2023-11-21 NOTE — Patient Instructions (Signed)
 It was very nice to see you today!  Get back on aspirin  and cholesterol   PLEASE NOTE:  If you had any lab tests please let us  know if you have not heard back within a few days. You may see your results on MyChart before we have a chance to review them but we will give you a call once they are reviewed by us . If we ordered any referrals today, please let us  know if you have not heard from their office within the next week.   Please try these tips to maintain a healthy lifestyle:  Eat most of your calories during the day when you are active. Eliminate processed foods including packaged sweets (pies, cakes, cookies), reduce intake of potatoes, white bread, white pasta, and white rice. Look for whole grain options, oat flour or almond flour.  Each meal should contain half fruits/vegetables, one quarter protein, and one quarter carbs (no bigger than a computer mouse).  Cut down on sweet beverages. This includes juice, soda, and sweet tea. Also watch fruit intake, though this is a healthier sweet option, it still contains natural sugar! Limit to 3 servings daily.  Drink at least 1 glass of water with each meal and aim for at least 8 glasses per day  Exercise at least 150 minutes every week.

## 2023-11-21 NOTE — Progress Notes (Signed)
 Phone [phone]   Subjective:   Patient is a 63 y.o. female presenting for annual physical.    Chief Complaint  Patient presents with   Annual Exam    CPE Fasting    Annual-walks   sees gyn.  Pap due Discussed the use of AI scribe software for clinical note transcription with the patient, who gave verbal consent to proceed.  History of Present Illness Leslie Roberts is a 63 year old female with coronary artery disease who presents for an annual physical exam. Last cpe 12/2021  She discontinued all her medications, including rosuvastatin  and aspirin , approximately six months ago due to running out of them and not refilling, particularly during her recent trip to [place]. She has a history of coronary artery disease and underwent angioplasty but has not had any stents placed. She reports no recent chest pain, palpitations, or dyspnea.  She experiences occasional dysphagia with dry foods like steak but denies odynophagia, hoarseness, or other swallowing difficulties. She manages her allergies with regular use of Zyrtec.  She recently retired and has been occupied with renovating her father's house over the past year. She engages in daily walking for exercise and acknowledges the need to incorporate resistance training into her routine.  She expresses concern about her thyroid after a friend's comment on its appearance but has not experienced symptoms such as headaches, dizziness, or visual disturbances. She also denies myalgias, arthralgias, or depressive symptoms.  She is currently sorting out her insurance coverage with COBRA and plans to schedule her routine Pap smear and mammogram, which are due.    See problem oriented charting- ROS- ROS: Gen: no fever, chills  Skin: no rash, itching ENT: no ear pain, ear drainage, nasal congestion, rhinorrhea, sinus pressure, sore throat.  allergies Eyes: no blurry vision, double vision Resp: no cough, wheeze,SOB CV: no CP, palpitations, LE  edema,  GI: no heartburn, n/v/d/c, abd pain GU: no dysuria, urgency, frequency, hematuria MSK: no joint pain, myalgias, back pain Neuro: no dizziness, headache, weakness, vertigo Psych: no depression, anxiety, insomnia, SI   The following were reviewed and entered/updated in epic: Past Medical History:  Diagnosis Date   Allergy    CAD (coronary artery disease)    a. 01/2016: NSTEMI with 90% stenosis along D1 suspicious for SCAD. Otherwise, normal cors.    Cat allergies    Diverticulosis 04/2011   Heart attack (HCC) 01/31/2016   HLD (hyperlipidemia)    HSV-1 infection    HTN (hypertension)    Toenail fungus    Patient Active Problem List   Diagnosis Date Noted   History of myocardial infarction 06/21/2021   Spontaneous dissection of coronary artery 06/21/2021   HLD (hyperlipidemia) 02/02/2016   Benign essential HTN 02/02/2016   Nausea 02/02/2016   Past Surgical History:  Procedure Laterality Date   CARDIAC CATHETERIZATION N/A 02/01/2016   Procedure: Left Heart Cath and Coronary Angiography;  Surgeon: Sammy Crisp, MD;  Location: [facility] INVASIVE CV LAB;  Service: Cardiovascular;  Laterality: N/A;   COLONOSCOPY  2013   ENDOMETRIAL BIOPSY  10/2009   benign   REDUCTION MAMMAPLASTY Bilateral 1986    Family History  Problem Relation Age of Onset   Cancer Mother        unknown primary   Thyroid disease Mother        hyperthyroid   Hypertension Father    Heart disease Father    Diabetes Father    Heart attack Father    Thyroid disease Sister  hypothyroid   Hyperlipidemia Brother    Heart disease Paternal Grandfather    Colon cancer Neg Hx 78       Dec cancer unknown origin   Esophageal cancer Neg Hx    Stomach cancer Neg Hx    Breast cancer Neg Hx    Rectal cancer Neg Hx     Medications- reviewed and updated Current Outpatient Medications  Medication Sig Dispense Refill   aspirin  EC 81 MG EC tablet Take 1 tablet (81 mg total) by mouth daily. (Patient not  taking: Reported on 11/21/2023)     B Complex Vitamins (VITAMIN B COMPLEX) CAPS Take by mouth daily. (Patient not taking: Reported on 11/21/2023)     cetirizine (ZYRTEC) 10 MG tablet Take 10 mg by mouth. (Patient not taking: Reported on 11/21/2023)     fish oil-omega-3 fatty acids 1000 MG capsule Take 2 g by mouth daily. (Patient not taking: Reported on 11/21/2023)     LORazepam  (ATIVAN ) 0.5 MG tablet Take 1 tablet (0.5 mg total) by mouth every 8 (eight) hours as needed for anxiety. (Patient not taking: Reported on 11/21/2023) 10 tablet 0   Multiple Vitamin (MULTIVITAMIN) tablet Take 1 tablet by mouth daily. (Patient not taking: Reported on 11/21/2023)     Polyethyl Glycol-Propyl Glycol (SYSTANE FREE OP) Place 1 drop into both eyes daily as needed. For dry eyes (Patient not taking: Reported on 11/21/2023)     Quercetin 50 MG TABS Take by mouth. (Patient not taking: Reported on 11/21/2023)     rosuvastatin  (CRESTOR ) 10 MG tablet Take 1 tablet (10 mg total) by mouth daily. 90 tablet 0   valACYclovir  (VALTREX ) 500 MG tablet Take 1 tablet (500 mg total) by mouth 2 (two) times daily. Take for 3 days as needed. (Patient not taking: Reported on 11/21/2023) 30 tablet 1   vitamin C (ASCORBIC ACID) 500 MG tablet Take 500 mg by mouth daily. (Patient not taking: Reported on 11/21/2023)     zinc gluconate 50 MG tablet Take 50 mg by mouth daily. (Patient not taking: Reported on 11/21/2023)     No current facility-administered medications for this visit.    Allergies-reviewed and updated No Known Allergies  Social History   Social History Narrative   Computer planning-retired   Objective  Objective:  BP (!) 126/96 (BP Location: Left Arm, Cuff Size: Normal)   Pulse 78   Temp 98 F (36.7 C) (Temporal)   Resp 16   Ht 5' 2.5 (1.588 m)   Wt 161 lb 2 oz (73.1 kg)   LMP 06/12/2009   SpO2 98%   BMI 29.00 kg/m  Physical Exam  Gen: WDWN NAD HEENT: NCAT, conjunctiva not injected, sclera nonicteric TM WNL B, OP  moist, no exudates  NECK:  supple, no thyromegaly, no nodes, no carotid bruits CARDIAC: RRR, S1S2+, no murmur. DP 2+B LUNGS: CTAB. No wheezes ABDOMEN:  BS+, soft, NTND, No HSM, no masses EXT:  no edema MSK: no gross abnormalities. MS 5/5 all 4 NEURO: A&O x3.  CN II-XII intact.  PSYCH: normal mood. Good eye contact     Assessment and Plan   Health Maintenance counseling: 1. Anticipatory guidance: Patient counseled regarding regular dental exams q6 months, eye exams,  avoiding smoking and second hand smoke, limiting alcohol to 1 beverage per day, no illicit drugs.   2. Risk factor reduction:  Advised patient of need for regular exercise and diet rich and fruits and vegetables to reduce risk of heart attack and stroke. Exercise- +.  Wt Readings from Last 3 Encounters:  11/21/23 161 lb 2 oz (73.1 kg)  06/28/22 166 lb 2 oz (75.4 kg)  02/01/22 158 lb (71.7 kg)   3. Immunizations/screenings/ancillary studies Immunization History  Administered Date(s) Administered   Tdap 02/09/2014   Health Maintenance Due  Topic Date Due   Cervical Cancer Screening (HPV/Pap Cotest)  08/12/2023    4. Cervical cancer screening- will  sch 5. Breast cancer screening-  mammogram will sch 6. Colon cancer screening - utd 7. Skin cancer screening- advised regular sunscreen use. Denies worrisome, changing, or new skin lesions.  8. Birth control/STD check- n/a 9. Osteoporosis screening- n/a 10. Smoking associated screening - non smoker  Wellness examination -     CBC with Differential/Platelet -     COMPLETE METABOLIC PANEL WITHOUT GFR -     Hemoglobin A1c -     Lipid panel -     TSH  Elevated blood pressure reading  History of myocardial infarction  Pure hypercholesterolemia  Other orders -     Rosuvastatin  Calcium ; Take 1 tablet (10 mg total) by mouth daily.  Dispense: 90 tablet; Refill: 0   Wellness-anticipatory guidance.  Work on Diet/Exercise  Check CBC,CMP,lipids,TSH, A1C.  F/u 1 yr  -advised to get Tdap,shingrix, pneumovax-she will check insurance Assessment and Plan Assessment & Plan Coronary Artery Disease   She has coronary artery disease and has not taken rosuvastatin  and aspirin  for six months. She underwent angioplasty without stent placement. Resuming these medications is crucial to prevent cardiovascular events, as cholesterol buildup is asymptomatic until problematic. Despite normal cholesterol levels, medication is necessary due to existing heart disease. She agreed to resume medications. Vitamin E was discontinued as it lacks cardioprotective effects and may be harmful. Prescribe rosuvastatin  and aspirin . Encourage follow-up with cardiologist.  Thyroid Enlargement   She is concerned about possible thyroid enlargement after a friend's comment.  No immediate concern was identified.  General Health Maintenance   She is due for a Pap smear, mammogram, tetanus booster, shingles vaccine, and pneumonia vaccine. Transitioning to [employer] insurance may affect timing. Guidance on the importance of these screenings and vaccinations was provided. Schedule Pap smear with gynecologist and mammogram. Administer tetanus booster, shingles vaccine, and pneumonia vaccine after insurance is sorted.  Follow-up   She needs to follow up with her cardiologist and complete blood work to monitor liver and kidney function and cholesterol levels. These are important for managing coronary artery disease. Her LDL goal is less than 55, unlikely without medication. Follow up with cardiologist and complete blood work to check liver, kidney function, and cholesterol levels.  Elevated bp-pt will do home checks and f/u 3 wks-may need meds    Recommended follow up: Return in about 3 weeks (around 12/12/2023) for annual physical-1 yr, 3 wks bp.  Lab/Order associations:+ fasting  Ellsworth Haas, MD

## 2023-11-22 ENCOUNTER — Ambulatory Visit: Payer: Self-pay | Admitting: Family Medicine

## 2023-11-22 ENCOUNTER — Encounter: Payer: Self-pay | Admitting: Cardiology

## 2023-11-22 LAB — COMPLETE METABOLIC PANEL WITHOUT GFR
AG Ratio: 1.5 (calc) (ref 1.0–2.5)
ALT: 13 U/L (ref 6–29)
AST: 17 U/L (ref 10–35)
Albumin: 4.3 g/dL (ref 3.6–5.1)
Alkaline phosphatase (APISO): 86 U/L (ref 37–153)
BUN: 9 mg/dL (ref 7–25)
CO2: 26 mmol/L (ref 20–32)
Calcium: 9.2 mg/dL (ref 8.6–10.4)
Chloride: 100 mmol/L (ref 98–110)
Creat: 0.65 mg/dL (ref 0.50–1.05)
Globulin: 2.8 g/dL (ref 1.9–3.7)
Glucose, Bld: 90 mg/dL (ref 65–99)
Potassium: 3.9 mmol/L (ref 3.5–5.3)
Sodium: 137 mmol/L (ref 135–146)
Total Bilirubin: 0.6 mg/dL (ref 0.2–1.2)
Total Protein: 7.1 g/dL (ref 6.1–8.1)

## 2023-11-22 LAB — CBC WITH DIFFERENTIAL/PLATELET
Absolute Lymphocytes: 1699 {cells}/uL (ref 850–3900)
Absolute Monocytes: 342 {cells}/uL (ref 200–950)
Basophils Absolute: 30 {cells}/uL (ref 0–200)
Basophils Relative: 0.5 %
Eosinophils Absolute: 277 {cells}/uL (ref 15–500)
Eosinophils Relative: 4.7 %
HCT: 41.6 % (ref 35.0–45.0)
Hemoglobin: 14.1 g/dL (ref 11.7–15.5)
MCH: 32 pg (ref 27.0–33.0)
MCHC: 33.9 g/dL (ref 32.0–36.0)
MCV: 94.5 fL (ref 80.0–100.0)
MPV: 11.9 fL (ref 7.5–12.5)
Monocytes Relative: 5.8 %
Neutro Abs: 3552 {cells}/uL (ref 1500–7800)
Neutrophils Relative %: 60.2 %
Platelets: 283 10*3/uL (ref 140–400)
RBC: 4.4 10*6/uL (ref 3.80–5.10)
RDW: 12.8 % (ref 11.0–15.0)
Total Lymphocyte: 28.8 %
WBC: 5.9 10*3/uL (ref 3.8–10.8)

## 2023-11-22 LAB — HEMOGLOBIN A1C
Hgb A1c MFr Bld: 5.5 % (ref <5.7)
Mean Plasma Glucose: 111 mg/dL
eAG (mmol/L): 6.2 mmol/L

## 2023-11-22 LAB — LIPID PANEL
Cholesterol: 215 mg/dL — ABNORMAL HIGH (ref <200)
HDL: 63 mg/dL (ref >=50)
LDL Cholesterol (Calc): 127 mg/dL — ABNORMAL HIGH
Non-HDL Cholesterol (Calc): 152 mg/dL — ABNORMAL HIGH (ref <130)
Total CHOL/HDL Ratio: 3.4 (calc) (ref <5.0)
Triglycerides: 134 mg/dL (ref <150)

## 2023-11-22 LAB — TSH: TSH: 2.01 m[IU]/L (ref 0.40–4.50)

## 2023-11-22 NOTE — Progress Notes (Signed)
 Labs look ok except cholesterol-get back on crestor  as we discussed

## 2023-12-05 ENCOUNTER — Ambulatory Visit: Payer: Commercial Managed Care - PPO | Admitting: Obstetrics and Gynecology

## 2023-12-12 ENCOUNTER — Encounter: Payer: Self-pay | Admitting: Family Medicine

## 2023-12-12 ENCOUNTER — Ambulatory Visit (INDEPENDENT_AMBULATORY_CARE_PROVIDER_SITE_OTHER): Payer: Self-pay | Admitting: Family Medicine

## 2023-12-12 VITALS — BP 132/88 | HR 71 | Temp 97.6°F | Resp 16 | Ht 62.5 in | Wt 155.0 lb

## 2023-12-12 DIAGNOSIS — I2542 Coronary artery dissection: Secondary | ICD-10-CM

## 2023-12-12 DIAGNOSIS — I10 Essential (primary) hypertension: Secondary | ICD-10-CM | POA: Diagnosis not present

## 2023-12-12 NOTE — Patient Instructions (Signed)
 It was very nice to see you today!  Continue to track bp.  Continue behaving.   Send bp's in 1 month.    Let me know if issues.   Track events on good and bad days.    PLEASE NOTE:  If you had any lab tests please let us  know if you have not heard back within a few days. You may see your results on MyChart before we have a chance to review them but we will give you a call once they are reviewed by us . If we ordered any referrals today, please let us  know if you have not heard from their office within the next week.   Please try these tips to maintain a healthy lifestyle:  Eat most of your calories during the day when you are active. Eliminate processed foods including packaged sweets (pies, cakes, cookies), reduce intake of potatoes, white bread, white pasta, and white rice. Look for whole grain options, oat flour or almond flour.  Each meal should contain half fruits/vegetables, one quarter protein, and one quarter carbs (no bigger than a computer mouse).  Cut down on sweet beverages. This includes juice, soda, and sweet tea. Also watch fruit intake, though this is a healthier sweet option, it still contains natural sugar! Limit to 3 servings daily.  Drink at least 1 glass of water with each meal and aim for at least 8 glasses per day  Exercise at least 150 minutes every week.

## 2023-12-12 NOTE — Progress Notes (Signed)
 Subjective:     Patient ID: Leslie Roberts, female    DOB: 1960/12/22, 63 y.o.   MRN: [id number]  Chief Complaint  Patient presents with   Medical Management of Chronic Issues    3 week follow-up on bp Has been monitoring blood pressure since last ov, has readings Ate a few nuts this morning    HPI Discussed the use of AI scribe software for clinical note transcription with the patient, who gave verbal consent to proceed.  History of Present Illness Leslie Roberts is a 63 year old female with hypertension who presents for follow-up on her blood pressure.  She has been monitoring her blood pressure since her last visit, noting that her readings have been slightly high but improved compared to previous measurements. Her readings are generally in the 130s over high 80s to low 90s, with occasional better numbers and some worse. No chest pain or shortness of breath. H/o spont coronary dissection  She has resumed physical activity, including walking and light weightlifting, after a period of inactivity due to personal commitments, such as cleaning her father's house and a vacation in [place]. During her vacation, she walked frequently and ate well, which she believed contributed to her health.  She has been focusing on dietary changes, including eating salads daily and avoiding cheese for several weeks. She acknowledges the high sodium content in foods like feta cheese, which she consumed in [place]. She has also reduced her alcohol intake, noting that she has not been drinking wine recently.  She has lost six pounds in less than a month, attributing this to her dietary changes and increased physical activity.  She is currently taking Crestor , which she has recently resumed. She is unsure of the specific blood pressure medication she was on previously but recalls it might have been lisinopril . She has contacted her cardiologist to schedule an appointment, but the earliest available is in  September, and she is on a waitlist for an earlier slot.    Health Maintenance Due  Topic Date Due   Cervical Cancer Screening (HPV/Pap Cotest)  08/12/2023    Past Medical History:  Diagnosis Date   Allergy    CAD (coronary artery disease)    a. 01/2016: NSTEMI with 90% stenosis along D1 suspicious for SCAD. Otherwise, normal cors.    Cat allergies    Diverticulosis 04/2011   Heart attack (HCC) 01/31/2016   HLD (hyperlipidemia)    HSV-1 infection    HTN (hypertension)    Toenail fungus     Past Surgical History:  Procedure Laterality Date   CARDIAC CATHETERIZATION N/A 02/01/2016   Procedure: Left Heart Cath and Coronary Angiography;  Surgeon: Lonni Hanson, MD;  Location: [facility] INVASIVE CV LAB;  Service: Cardiovascular;  Laterality: N/A;   COLONOSCOPY  2013   ENDOMETRIAL BIOPSY  10/2009   benign   REDUCTION MAMMAPLASTY Bilateral 1986     Current Outpatient Medications:    aspirin  EC 81 MG EC tablet, Take 1 tablet (81 mg total) by mouth daily., Disp: , Rfl:    B Complex Vitamins (VITAMIN B COMPLEX) CAPS, Take by mouth daily., Disp: , Rfl:    cetirizine (ZYRTEC) 10 MG tablet, Take 10 mg by mouth., Disp: , Rfl:    fish oil-omega-3 fatty acids 1000 MG capsule, Take 2 g by mouth daily., Disp: , Rfl:    LORazepam  (ATIVAN ) 0.5 MG tablet, Take 1 tablet (0.5 mg total) by mouth every 8 (eight) hours as needed for anxiety., Disp: 10 tablet,  Rfl: 0   Multiple Vitamin (MULTIVITAMIN) tablet, Take 1 tablet by mouth daily., Disp: , Rfl:    Polyethyl Glycol-Propyl Glycol (SYSTANE FREE OP), Place 1 drop into both eyes daily as needed. For dry eyes, Disp: , Rfl:    Quercetin 50 MG TABS, Take by mouth., Disp: , Rfl:    rosuvastatin  (CRESTOR ) 10 MG tablet, Take 1 tablet (10 mg total) by mouth daily., Disp: 90 tablet, Rfl: 0   valACYclovir  (VALTREX ) 500 MG tablet, Take 1 tablet (500 mg total) by mouth 2 (two) times daily. Take for 3 days as needed., Disp: 30 tablet, Rfl: 1   vitamin C (ASCORBIC  ACID) 500 MG tablet, Take 500 mg by mouth daily., Disp: , Rfl:    zinc gluconate 50 MG tablet, Take 50 mg by mouth daily., Disp: , Rfl:   No Known Allergies ROS neg/noncontributory except as noted HPI/below      Objective:     BP 132/88 (BP Location: Left Arm, Patient Position: Sitting, Cuff Size: Normal)   Pulse 71   Temp 97.6 F (36.4 C) (Temporal)   Resp 16   Ht 5' 2.5 (1.588 m)   Wt 155 lb (70.3 kg)   LMP 06/12/2009   SpO2 95%   BMI 27.90 kg/m  Wt Readings from Last 3 Encounters:  12/12/23 155 lb (70.3 kg)  11/21/23 161 lb 2 oz (73.1 kg)  06/28/22 166 lb 2 oz (75.4 kg)    Physical Exam   Gen: WDWN NAD HEENT: NCAT, conjunctiva not injected, sclera nonicteric NECK:  supple, no thyromegaly, no nodes, no carotid bruits CARDIAC: RRR, S1S2+, no murmur.  EXT:  no edema MSK: no gross abnormalities.  NEURO: A&O x3.  CN II-XII intact.  PSYCH: normal mood. Good eye contact     Assessment & Plan:  Benign essential HTN  Spontaneous dissection of coronary artery  Assessment and Plan Assessment & Plan Hypertension   Blood pressure remains slightly elevated at 130s/80s-90s, with no chest pain or dyspnea. She has resumed exercise and dietary modifications, showing some improvement. Prefers to continue lifestyle changes over starting a low-dose antihypertensive. Emphasized avoiding high sodium foods and tracking dietary habits. Monitor blood pressure and send readings in one month. Encourage continued exercise and healthy diet. Contact cardiologist to expedite appointment. Document dietary and lifestyle factors affecting blood pressure.  Hyperlipidemia   She has resumed Crestor  for hyperlipidemia management. No further discussion on lipid levels or additional management needed. Continue Crestor  as prescribed.    Return in about 1 year (around 12/11/2024) for annual physical.  Jenkins CHRISTELLA Carrel, MD

## 2024-03-04 ENCOUNTER — Ambulatory Visit: Payer: Self-pay | Admitting: Cardiology

## 2024-03-14 ENCOUNTER — Encounter: Payer: Self-pay | Admitting: Cardiology

## 2024-03-14 ENCOUNTER — Ambulatory Visit: Payer: Self-pay | Attending: Cardiology | Admitting: Cardiology

## 2024-03-14 VITALS — BP 149/97 | HR 80 | Ht 63.5 in | Wt 153.0 lb

## 2024-03-14 DIAGNOSIS — I10 Essential (primary) hypertension: Secondary | ICD-10-CM | POA: Diagnosis not present

## 2024-03-14 DIAGNOSIS — E7800 Pure hypercholesterolemia, unspecified: Secondary | ICD-10-CM

## 2024-03-14 DIAGNOSIS — I2542 Coronary artery dissection: Secondary | ICD-10-CM

## 2024-03-14 MED ORDER — VALSARTAN 40 MG PO TABS: 40.0000 mg | ORAL_TABLET | Freq: Every day | ORAL | 11 refills | Status: AC

## 2024-03-14 NOTE — Progress Notes (Signed)
 Cardiology Office Note:  .   Date:  03/14/2024  ID:  Leslie Roberts, DOB 1960-07-07, MRN [id number] PCP: Wendolyn Jenkins Jansky, MD  [facility] Providers Cardiologist:  Oneil Parchment, MD     History of Present Illness: .   Shanina Kepple is a 63 y.o. female Discussed the use of AI scribe software   History of Present Illness Leslie Roberts is a 63 year old female with coronary artery disease who presents for follow-up.  She has a history of a non-ST elevation myocardial infarction in 2017, during which a distal tapering of the diagonal branch was suspicious for spontaneous coronary artery dissection. Medical therapy was initiated at that time. She has a history of mild aortic regurgitation and an ejection fraction of 60-65% on echocardiogram in 2017.  Her blood pressure has been elevated recently, initially noted during a general physical examination. She has been monitoring it at home, with values initially high but managed to lower to around 125/80-85 mmHg through dietary modifications and regular exercise, including walking for at least 30 minutes daily. However, she notes that her blood pressure tends to rise again, especially after travel. She was previously on metoprolol  12.5 mg, which was stopped, and she does not recall any issues with it. She is currently not on any blood pressure medication.  She is on Crestor  10 mg daily, with a prior LDL of 53 mg/dL. However, she has not been taking it recently. Her last LDL was 127 mg/dL, and her total cholesterol was 212 mg/dL. She has concerns about the long-term effects of statins.  Her renal artery duplex showed no evidence of renal artery stenosis. Her last creatinine was 0.6, indicating good kidney function, and her A1c was 5.5.  She recently traveled to [place] and [place], indicating an active lifestyle.     Studies Reviewed: SABRA   EKG Interpretation Date/Time:  Friday March 14 2024 10:45:35 EDT Ventricular Rate:  76 PR  Interval:  188 QRS Duration:  82 QT Interval:  340 QTC Calculation: 382 R Axis:   86  Text Interpretation: Normal sinus rhythm Normal ECG When compared with ECG of 02-Feb-2016 04:44, T wave inversion less evident in Anterolateral leads Confirmed by Parchment Oneil ([id number]) on 03/14/2024 11:06:22 AM    Results LABS Creatinine: 0.6 A1c: 5.5 LDL: 127  RADIOLOGY Renal artery duplex: No evidence of renal artery stenosis  DIAGNOSTIC Echocardiogram: Mild aortic regurgitation, ejection fraction 60-65% EKG: Normal Risk Assessment/Calculations:           Physical Exam:   VS:  BP (!) 149/97   Pulse 80   Ht 5' 3.5 (1.613 m)   Wt 153 lb (69.4 kg)   LMP 06/12/2009   SpO2 98%   BMI 26.68 kg/m    Wt Readings from Last 3 Encounters:  03/14/24 153 lb (69.4 kg)  12/12/23 155 lb (70.3 kg)  11/21/23 161 lb 2 oz (73.1 kg)    GEN: Well nourished, well developed in no acute distress NECK: No JVD; No carotid bruits CARDIAC: RRR, no murmurs, no rubs, no gallops RESPIRATORY:  Clear to auscultation without rales, wheezing or rhonchi  ABDOMEN: Soft, non-tender, non-distended EXTREMITIES:  No edema; No deformity   ASSESSMENT AND PLAN: .    Assessment and Plan Assessment & Plan Coronary artery disease with prior spontaneous coronary artery dissection Non-ST elevation myocardial infarction in 2017 with distal tapering of the diagonal branch suspicious for spontaneous coronary artery dissection. Currently asymptomatic. Echocardiogram in 2017 showed mild aortic regurgitation and an ejection  fraction of 60-65%.  Hypertension Blood pressure elevated during recent examination. Home monitoring shows 125/80-85 mmHg. Lifestyle modifications attempted for 3-4 months with partial success. Discussed potential need for medication if lifestyle changes are insufficient. - Starting valsartan 40 mg at a low dose noting its efficacy in blood pressure control without increasing urination.  Hyperlipidemia LDL  cholesterol at 127 mg/dL, higher than desired. Previously on Crestor  10 mg daily, currently not taking it. Discussed benefits of statins in reducing recurrent myocardial infarction risk, despite concerns about side effects and conflicting non-medical information. - Restart Crestor  10 mg daily.          Signed, Oneil Parchment, MD

## 2024-03-14 NOTE — Patient Instructions (Addendum)
 Medication Instructions:  Your physician has recommended you make the following change in your medication:  START Valsartan 40 mg daily  *If you need a refill on your cardiac medications before your next appointment, please call your pharmacy*  Lab Work: None ordered  Testing/Procedures: None ordered  Follow-Up: At [facility], you and your health needs are our priority.  As part of our continuing mission to provide you with exceptional heart care, our providers are all part of one team.  This team includes your primary Cardiologist (physician) and Advanced Practice Providers or APPs (Physician Assistants and Nurse Practitioners) who all work together to provide you with the care you need, when you need it.  Your next appointment:   1 year(s)  Provider:   Oneil Parchment, MD     Thank you for choosing [facility]!!   [phone]    Valsartan Tablets What is this medication? VALSARTAN (val SAR tan) treats high blood pressure and heart failure. It may also be used to prevent further damage after a heart attack.  It works by relaxing blood vessels, which decreases the amount of work the heart has to do.  It belongs to a group of medications called ARBs. This medicine may be used for other purposes; ask your health care provider or pharmacist if you have questions. COMMON BRAND NAME(S): Diovan What should I tell my care team before I take this medication? They need to know if you have any of these conditions: Heart failure Kidney disease Liver disease An unusual or allergic reaction to valsartan, other medications, foods, dyes, or preservatives Pregnant or trying to get pregnant Breastfeeding How should I use this medication? Take this medication by mouth. Take it as directed on the prescription label at the same time every day. You can take it with or without food. If it upsets your stomach, take it with food. Keep taking it unless your care team tells you to  stop. Talk to your care team about the use of this medication in children. While it may be prescribed for children as young as 1 year for selected conditions, precautions do apply. Overdosage: If you think you have taken too much of this medicine contact a poison control center or emergency room at once. NOTE: This medicine is only for you. Do not share this medicine with others. What if I miss a dose? If you miss a dose, take it as soon as you can. If it is almost time for your next dose, take only that dose. Do not take double or extra doses. What may interact with this medication? ACE inhibitors, such as enalapril or lisinopril  Aliskiren Diuretics, such as amiloride, spironolactone, triamterene Lithium NSAIDs, medications for pain and inflammation, such as ibuprofen or naproxen Potassium supplements This list may not describe all possible interactions. Give your health care provider a list of all the medicines, herbs, non-prescription drugs, or dietary supplements you use. Also tell them if you smoke, drink alcohol, or use illegal drugs. Some items may interact with your medicine. What should I watch for while using this medication? Visit your care team for regular checks on your progress. Check your blood pressure as directed. Know what your blood pressure should be and when to contact your care team. Do not treat yourself for coughs, colds, or pain while you are taking this medication without asking your care team for advice. Some medications may increase your blood pressure. This medication may affect your coordination, reaction time, or judgment. Do not  drive or operate machinery until you know how this medication affects you. Sit up or stand slowly to reduce the risk of dizzy or fainting spells. Drinking alcohol with this medication can increase the risk of these side effects. Avoid salt substitutes unless you are told otherwise by your care team. Talk to your care team if you may be  pregnant. Serious fetal side effects can occur if you take this medication during the second and third trimesters. Discuss other treatment options with your care team. There are benefits and risks to taking medications during pregnancy. Your care team can help you find the option that works for you. What side effects may I notice from receiving this medication? Side effects that you should report to your care team as soon as possible: Allergic reactions--skin rash, itching, hives, swelling of the face, lips, tongue, or throat High potassium level--muscle weakness, fast or irregular heartbeat Kidney injury--decrease in the amount of urine, swelling of the ankles, hands, or feet Low blood pressure--dizziness, feeling faint or lightheaded, blurry vision Side effects that usually do not require medical attention (report these to your care team if they continue or are bothersome): Dizziness Fatigue Headache This list may not describe all possible side effects. Call your doctor for medical advice about side effects. You may report side effects to FDA at 1-800-FDA-1088. Where should I keep my medication? Keep out of the reach of children and pets. Store at room temperature between 20 and 25 degrees C (68 and 77 degrees F). Protect from moisture. Keep the container tightly closed. Get rid of any unused medication after the expiration date. To get rid of medications that are no longer needed or have expired: Take the medication to a take-back program. Check with your pharmacy or law enforcement to find a location. If you cannot return the medication, check the label or package insert to see if the medication should be thrown out in the garbage or flushed down the toilet. If you are not sure, ask your care team. If it is safe to put it in the trash, empty the medication out of the container. Mix it with cat litter, dirt, coffee grounds, or another unwanted substance. Seal the mixture in a bag or container.  Put it in the trash. NOTE: This sheet is a summary. It may not cover all possible information. If you have questions about this medicine, talk to your doctor, pharmacist, or health care provider.  2025 Elsevier/Gold Standard (2023-09-27 00:00:00)

## 2024-05-12 ENCOUNTER — Other Ambulatory Visit: Payer: Self-pay

## 2024-05-12 NOTE — Telephone Encounter (Signed)
 Med refill request: valtrex   Last AEX: 02/01/22  Next AEX: 07/10/24 Last MMG (if hormonal med) Refill authorized: last rx 02/01/22 #30 with 1 refill. Please advise

## 2024-05-13 MED ORDER — VALACYCLOVIR HCL 500 MG PO TABS: 500.0000 mg | ORAL_TABLET | Freq: Two times a day (BID) | ORAL | 0 refills | Status: AC

## 2024-05-14 ENCOUNTER — Other Ambulatory Visit: Payer: Self-pay

## 2024-05-14 NOTE — Telephone Encounter (Signed)
 Med refill request:   valACYclovir  (VALTREX ) 500 MG tablet  Start:   Disp: 30  tablets Refills:  0  Request responded to yesterday, by other means.

## 2024-07-10 ENCOUNTER — Ambulatory Visit: Admitting: Obstetrics and Gynecology

## 2024-12-23 ENCOUNTER — Encounter: Admitting: Family Medicine
